# Patient Record
Sex: Female | Born: 1985
Health system: Southern US, Community
[De-identification: ages and names within clinical notes are randomized; demographics above are authoritative.]

## PROBLEM LIST (undated history)

## (undated) ENCOUNTER — Inpatient Hospital Stay (HOSPITAL_COMMUNITY): Payer: Self-pay

## (undated) DIAGNOSIS — J45909 Unspecified asthma, uncomplicated: Secondary | ICD-10-CM

## (undated) DIAGNOSIS — F909 Attention-deficit hyperactivity disorder, unspecified type: Secondary | ICD-10-CM

## (undated) HISTORY — DX: Attention-deficit hyperactivity disorder, unspecified type: F90.9

## (undated) HISTORY — PX: NO PAST SURGERIES: SHX2092

---

## 2017-02-09 ENCOUNTER — Ambulatory Visit (HOSPITAL_COMMUNITY): Payer: Self-pay | Admitting: Psychiatry

## 2017-11-22 NOTE — L&D Delivery Note (Signed)
Delivery Note At 10:43 PM a viable female was delivered via Vaginal, Spontaneous (Presentation:direct OA).  APGAR: 8,9 ; weight pending .   Placenta status:  spontaneous ,intact.   Cord:  with the following complications: None .  Cord pH: n/a Placenta felt warm.  Maternal temp 101.2.  Antibiotics held.  Anesthesia:  Epidural Episiotomy:  None Lacerations: Periurethral;Vaginal (left hymenal) Suture Repair: 3.0 chromic Est. Blood Loss (mL): 122  Mom to postpartum.  Baby to Couplet care / Skin to Skin.  Shelly Allen 10/30/2018, 11:43 PM

## 2017-12-16 ENCOUNTER — Other Ambulatory Visit: Payer: Self-pay

## 2017-12-16 ENCOUNTER — Inpatient Hospital Stay (HOSPITAL_COMMUNITY): Payer: 59

## 2017-12-16 ENCOUNTER — Other Ambulatory Visit: Payer: Self-pay | Admitting: Obstetrics and Gynecology

## 2017-12-16 ENCOUNTER — Inpatient Hospital Stay (HOSPITAL_COMMUNITY)
Admission: AD | Admit: 2017-12-16 | Discharge: 2017-12-16 | Disposition: A | Discharge status: Home / Self Care | Payer: 59 | Source: Ambulatory Visit | Attending: Obstetrics and Gynecology | Admitting: Obstetrics and Gynecology

## 2017-12-16 ENCOUNTER — Encounter (HOSPITAL_COMMUNITY): Payer: Self-pay | Admitting: *Deleted

## 2017-12-16 DIAGNOSIS — R1032 Left lower quadrant pain: Secondary | ICD-10-CM | POA: Diagnosis not present

## 2017-12-16 DIAGNOSIS — Z3A10 10 weeks gestation of pregnancy: Secondary | ICD-10-CM | POA: Insufficient documentation

## 2017-12-16 DIAGNOSIS — O209 Hemorrhage in early pregnancy, unspecified: Secondary | ICD-10-CM | POA: Diagnosis present

## 2017-12-16 DIAGNOSIS — O3680X0 Pregnancy with inconclusive fetal viability, not applicable or unspecified: Secondary | ICD-10-CM

## 2017-12-16 DIAGNOSIS — O26891 Other specified pregnancy related conditions, first trimester: Secondary | ICD-10-CM | POA: Diagnosis not present

## 2017-12-16 DIAGNOSIS — R109 Unspecified abdominal pain: Secondary | ICD-10-CM

## 2017-12-16 NOTE — MAU Note (Signed)
Urine is in the Lab 

## 2017-12-16 NOTE — MAU Note (Signed)
Sharen CounterLisa Leftwich-Kirby CNM spoke with pt in Triage regarding test results and d/c plan. CNM discussed d/c instructions with pt and understanding voiced. PT d/c home from Triage

## 2017-12-16 NOTE — MAU Note (Signed)
Dr sent her here from [facility] , been having cramping and spotting today. ? Ectopic, saw a saw, but nothing in it, lagging 5 wks from LMP

## 2017-12-16 NOTE — MAU Provider Note (Signed)
Chief Complaint: Vaginal Bleeding and Abdominal Pain   First Provider Initiated Contact with Patient 12/16/17 2122      SUBJECTIVE HPI: Shelly Allen is a 32 y.o. G1P0 at [redacted]w[redacted]d by unsure LMP presents to MAU sent from the office with scant bleeding and mild LLQ cramping pain. In the office, GS was visualized on [place] but no YS or FP.  Quant hcg today was 14, 312.  She has not tried any treatments. There are no other associated symptoms.      HPI  No past medical history on file.  Social History   Socioeconomic History  . Marital status: Married    Spouse name: Not on file  . Number of children: Not on file  . Years of education: Not on file  . Highest education level: Not on file  Social Needs  . Financial resource strain: Not on file  . Food insecurity - worry: Not on file  . Food insecurity - inability: Not on file  . Transportation needs - medical: Not on file  . Transportation needs - non-medical: Not on file  Occupational History  . Not on file  Tobacco Use  . Smoking status: Not on file  Substance and Sexual Activity  . Alcohol use: Not on file  . Drug use: Not on file  . Sexual activity: Not on file  Other Topics Concern  . Not on file  Social History Narrative  . Not on file   No current facility-administered medications on file prior to encounter.    No current outpatient medications on file prior to encounter.   Allergies not on file  ROS:  Review of Systems  Constitutional: Negative for chills, fatigue and fever.  Respiratory: Negative for shortness of breath.   Cardiovascular: Negative for chest pain.  Genitourinary: Positive for pelvic pain and vaginal bleeding. Negative for difficulty urinating, dysuria, flank pain, vaginal discharge and vaginal pain.  Neurological: Negative for dizziness and headaches.  Psychiatric/Behavioral: Negative.      I have reviewed patient's Past Medical Hx, Surgical Hx, Family Hx, Social Hx, medications and allergies.    Physical Exam   Patient Vitals for the past 24 hrs:  BP Temp Temp src Pulse Resp SpO2 Height Weight  12/16/17 1826 112/70 98.3 F (36.8 C) Oral 64 17 100 % 5' 4.5" (1.638 m) 115 lb 8 oz (52.4 kg)   Constitutional: Well-developed, well-nourished female in no acute distress.  Cardiovascular: normal rate Respiratory: normal effort GI: Abd soft, non-tender. Pos BS x 4 MS: Extremities nontender, no edema, normal ROM Neurologic: Alert and oriented x 4.  GU: Neg CVAT.  PELVIC EXAM: Deferred   LAB RESULTS No results found for this or any previous visit (from the past 24 hour(s)).  Quant hcg in office 12/16/17 was 44034    IMAGING US Ob Comp Less 14 Wks  Result Date: 12/16/2017 CLINICAL DATA:  Bleeding and cramping today. HCG 14,000 per patient, drawn at referring office. EXAM: OBSTETRIC <14 WK [place] AND TRANSVAGINAL OB US TECHNIQUE: Both transabdominal and transvaginal ultrasound examinations were performed for complete evaluation of the gestation as well as the maternal uterus, adnexal regions, and pelvic cul-de-sac. Transvaginal technique was performed to assess early pregnancy. COMPARISON:  None. FINDINGS: Intrauterine gestational sac: Single Yolk sac:  Not visualized Embryo:  Not visualized Cardiac Activity: Not visualized Heart Rate: Not applicable MSD: 9.2  mm   5 w   5 d Subchorionic hemorrhage:  None visualized. Maternal uterus/adnexae: Normal IMPRESSION: Probable early intrauterine gestational  sac, but no yolk sac, fetal pole, or cardiac activity yet visualized. Recommend follow-up quantitative B-HCG levels and follow-up US in 14 days to assess viability. This recommendation follows SRU consensus guidelines: Diagnostic Criteria for Nonviable Pregnancy Early in the First Trimester. Malva Limes Engl J Med 2013; 161:0960-45; 369:1443-51. Electronically Signed   By: Tollie Ethavid  Kwon M.D.   On: 12/16/2017 21:09   [place] Ob Transvaginal  Result Date: 12/16/2017 CLINICAL DATA:  Bleeding and cramping today. HCG 14,000 per  patient, drawn at referring office. EXAM: OBSTETRIC <14 WK US AND TRANSVAGINAL OB US TECHNIQUE: Both transabdominal and transvaginal ultrasound examinations were performed for complete evaluation of the gestation as well as the maternal uterus, adnexal regions, and pelvic cul-de-sac. Transvaginal technique was performed to assess early pregnancy. COMPARISON:  None. FINDINGS: Intrauterine gestational sac: Single Yolk sac:  Not visualized Embryo:  Not visualized Cardiac Activity: Not visualized Heart Rate: Not applicable MSD: 9.2  mm   5 w   5 d Subchorionic hemorrhage:  None visualized. Maternal uterus/adnexae: Normal IMPRESSION: Probable early intrauterine gestational sac, but no yolk sac, fetal pole, or cardiac activity yet visualized. Recommend follow-up quantitative B-HCG levels and follow-up US in 14 days to assess viability. This recommendation follows SRU consensus guidelines: Diagnostic Criteria for Nonviable Pregnancy Early in the First Trimester. Malva Limes Engl J Med 2013; 409:8119-14; 369:1443-51. Electronically Signed   By: Tollie Ethavid  Kwon M.D.   On: 12/16/2017 21:09    MAU Management/MDM: US with similar results as the office US with gestational sac but no yolk sac or fetal pole.  Pt with mild LLQ pain. Consult Dr Dion BodyVarnado. Findings today could represent a normal early pregnancy, spontaneous abortion or ectopic pregnancy which can be life-threatening.  Ectopic precautions were given to the patient with plan to return in 48 hours for repeat quant hcg to evaluate pregnancy development.   Pt to return on Sunday 12/18/16 for repeat quant hcg or sooner if more pain/bleeding. Pt discharged with strict ectopic precautions.  ASSESSMENT 1. Abdominal pain during pregnancy in first trimester   2. Pregnancy of unknown anatomic location   3. Vaginal bleeding in pregnancy, first trimester     PLAN Discharge home Allergies as of 12/16/2017   Not on File     Medication List    You have not been prescribed any medications.       Sharen CounterLisa Leftwich-Kirby Certified Nurse-Midwife 12/16/2017  11:05 PM

## 2017-12-17 ENCOUNTER — Inpatient Hospital Stay (HOSPITAL_COMMUNITY)
Admission: AD | Admit: 2017-12-17 | Discharge: 2017-12-17 | Disposition: A | Discharge status: Home / Self Care | Payer: 59 | Source: Ambulatory Visit | Attending: Obstetrics and Gynecology | Admitting: Obstetrics and Gynecology

## 2017-12-17 ENCOUNTER — Inpatient Hospital Stay (HOSPITAL_COMMUNITY): Payer: 59

## 2017-12-17 ENCOUNTER — Encounter (HOSPITAL_COMMUNITY): Payer: Self-pay

## 2017-12-17 DIAGNOSIS — R109 Unspecified abdominal pain: Secondary | ICD-10-CM | POA: Diagnosis present

## 2017-12-17 DIAGNOSIS — N939 Abnormal uterine and vaginal bleeding, unspecified: Secondary | ICD-10-CM

## 2017-12-17 DIAGNOSIS — Z3A10 10 weeks gestation of pregnancy: Secondary | ICD-10-CM | POA: Diagnosis not present

## 2017-12-17 DIAGNOSIS — O039 Complete or unspecified spontaneous abortion without complication: Secondary | ICD-10-CM | POA: Insufficient documentation

## 2017-12-17 LAB — CBC
HCT: 34.4 % — ABNORMAL LOW (ref 36.0–46.0)
HEMOGLOBIN: 12.2 g/dL (ref 12.0–15.0)
MCH: 31 pg (ref 26.0–34.0)
MCHC: 35.5 g/dL (ref 30.0–36.0)
MCV: 87.3 fL (ref 78.0–100.0)
Platelets: 189 10*3/uL (ref 150–400)
RBC: 3.94 MIL/uL (ref 3.87–5.11)
RDW: 12.2 % (ref 11.5–15.5)
WBC: 7.1 10*3/uL (ref 4.0–10.5)

## 2017-12-17 MED ORDER — HYDROMORPHONE HCL 1 MG/ML IJ SOLN
1.0000 mg | Freq: Once | INTRAMUSCULAR | Status: AC
  Administered 2017-12-17: 1 mg via INTRAMUSCULAR
  Filled 2017-12-17: qty 1

## 2017-12-17 NOTE — Discharge Instructions (Signed)

## 2017-12-17 NOTE — MAU Provider Note (Signed)
History     CSN: [id number]  Arrival date and time: 12/17/17 [id number]   First Provider Initiated Contact with Patient 12/17/17 [phone] [id number]     Chief Complaint  Patient presents with  . Abdominal Pain  . Vaginal Bleeding  . Emesis   HPI Shelly Allen is a 32 y.o. G1P0 at 9077w3d by unsure LMP who presents with abdominal pain and vaginal bleeding. In the office yesterday afternoon, GS was visualized on US but no YS or FP.  Quant hcg today was 14,312. US in MAU last night showed the same. She reports at 0000 she began having cramping that she would rate a 10/10 and has not tried anything for the pain. She also reports vaginal bleeding like a period. She had vomiting upon arrival to MAU.  OB History    Gravida Para Term Preterm AB Living   1             SAB TAB Ectopic Multiple Live Births                  History reviewed. No pertinent past medical history.  History reviewed. No pertinent surgical history.  No family history on file.  Social History   Tobacco Use  . Smoking status: Never Smoker  . Smokeless tobacco: Never Used  Substance Use Topics  . Alcohol use: No    Frequency: Never  . Drug use: No    Allergies: No Known Allergies  No medications prior to admission.    Review of Systems  Constitutional: Negative.  Negative for fatigue and fever.  HENT: Negative.   Respiratory: Negative.  Negative for shortness of breath.   Cardiovascular: Negative.  Negative for chest pain.  Gastrointestinal: Positive for abdominal pain. Negative for constipation, diarrhea, nausea and vomiting.  Genitourinary: Positive for vaginal bleeding. Negative for dysuria.  Neurological: Negative.  Negative for dizziness and headaches.   Physical Exam   Blood pressure 124/78, pulse 80, temperature 98.1 F (36.7 C), temperature source Oral, resp. rate 20, last menstrual period 10/05/2017, SpO2 98 %.  Physical Exam  Nursing note and vitals reviewed. Constitutional: She is oriented to person,  place, and time. She appears well-developed and well-nourished. No distress.  HENT:  Head: Normocephalic.  Eyes: Pupils are equal, round, and reactive to light.  Cardiovascular: Normal rate, regular rhythm and normal heart sounds.  Respiratory: Effort normal and breath sounds normal. No respiratory distress.  GI: Soft. Bowel sounds are normal. She exhibits no distension. There is no tenderness.  Genitourinary:  Genitourinary Comments: Large amount of dark red blood in vault. Products of conception protruding from cervix.   Neurological: She is alert and oriented to person, place, and time.  Skin: Skin is warm and dry.  Psychiatric: She has a normal mood and affect. Her behavior is normal. Judgment and thought content normal.    MAU Course  Procedures Results for orders placed or performed during the hospital encounter of 12/17/17 (from the past 24 hour(s))  CBC     Status: Abnormal   Collection Time: 12/17/17  4:53 AM  Result Value Ref Range   WBC 7.1 4.0 - 10.5 K/uL   RBC 3.94 3.87 - 5.11 MIL/uL   Hemoglobin 12.2 12.0 - 15.0 g/dL   HCT 47.834.4 (L) 29.536.0 - 62.146.0 %   MCV 87.3 78.0 - 100.0 fL   MCH 31.0 26.0 - 34.0 pg   MCHC 35.5 30.0 - 36.0 g/dL   RDW 30.812.2 65.711.5 - 84.615.5 %   Platelets 189  150 - 400 K/uL    US Ob Comp Less 14 Wks  Result Date: 12/16/2017 CLINICAL DATA:  Bleeding and cramping today. HCG 14,000 per patient, drawn at referring office. EXAM: OBSTETRIC <14 WK [place] AND TRANSVAGINAL OB US TECHNIQUE: Both transabdominal and transvaginal ultrasound examinations were performed for complete evaluation of the gestation as well as the maternal uterus, adnexal regions, and pelvic cul-de-sac. Transvaginal technique was performed to assess early pregnancy. COMPARISON:  None. FINDINGS: Intrauterine gestational sac: Single Yolk sac:  Not visualized Embryo:  Not visualized Cardiac Activity: Not visualized Heart Rate: Not applicable MSD: 9.2  mm   5 w   5 d Subchorionic hemorrhage:  None  visualized. Maternal uterus/adnexae: Normal IMPRESSION: Probable early intrauterine gestational sac, but no yolk sac, fetal pole, or cardiac activity yet visualized. Recommend follow-up quantitative B-HCG levels and follow-up US in 14 days to assess viability. This recommendation follows SRU consensus guidelines: Diagnostic Criteria for Nonviable Pregnancy Early in the First Trimester. Malva Limes Med 2013; 098:1191-47. Electronically Signed   By: Tollie Eth M.D.   On: 12/16/2017 21:09   US Ob Transvaginal  Result Date: 12/17/2017 CLINICAL DATA:  Abdominal pain and heavy bleeding. Follow-up from yesterday. EXAM: TRANSVAGINAL OB ULTRASOUND TECHNIQUE: Transvaginal ultrasound was performed for complete evaluation of the gestation as well as the maternal uterus, adnexal regions, and pelvic cul-de-sac. COMPARISON:  12/16/2017 FINDINGS: Intrauterine gestational sac: No intrauterine gestational sac identified today. The gestational sac seen on the previous study is no longer present suggesting interval loss of pregnancy. Yolk sac:  Not identified. Embryo:  Not identified. Cardiac Activity: Not identified. Maternal uterus/adnexae: Uterus is mildly anteverted. No myometrial mass lesions identified. Endometrial stripe thickness measures about 1.2 cm. No endometrial fluid collections. No significant flow is demonstrated within the endometrium on color flow Doppler imaging. Both ovaries are visualized and are normal. No abnormal pelvic fluid collections. IMPRESSION: Intrauterine gestational sac seen on previous study is no longer demonstrated today consistent with interval loss of pregnancy. Electronically Signed   By: Burman Nieves M.D.   On: 12/17/2017 05:06   US Ob Transvaginal  Result Date: 12/16/2017 CLINICAL DATA:  Bleeding and cramping today. HCG 14,000 per patient, drawn at referring office. EXAM: OBSTETRIC <14 WK [place] AND TRANSVAGINAL OB US TECHNIQUE: Both transabdominal and transvaginal ultrasound examinations  were performed for complete evaluation of the gestation as well as the maternal uterus, adnexal regions, and pelvic cul-de-sac. Transvaginal technique was performed to assess early pregnancy. COMPARISON:  None. FINDINGS: Intrauterine gestational sac: Single Yolk sac:  Not visualized Embryo:  Not visualized Cardiac Activity: Not visualized Heart Rate: Not applicable MSD: 9.2  mm   5 w   5 d Subchorionic hemorrhage:  None visualized. Maternal uterus/adnexae: Normal IMPRESSION: Probable early intrauterine gestational sac, but no yolk sac, fetal pole, or cardiac activity yet visualized. Recommend follow-up quantitative B-HCG levels and follow-up US in 14 days to assess viability. This recommendation follows SRU consensus guidelines: Diagnostic Criteria for Nonviable Pregnancy Early in the First Trimester. Malva Limes Med 2013; 829:5621-30. Electronically Signed   By: Tollie Eth M.D.   On: 12/16/2017 21:09    MDM Dilaudid IM US OB Transvaginal- gestational sac on previous ultrasound no longer seen. No retained POC Surgical Pathology CBC  Reviewed with Dr. Dion Body- ok to discharge patient home and follow up in 2 weeks in the office.   Assessment and Plan   1. Complete abortion   2. Vaginal bleeding    -Discharge home in  stable condition -Abdominal pain and vaginal bleeding precautions discussed -Patient advised to follow-up with [facility] in 2 weeks -Patient may return to MAU as needed or if her condition were to change or worsen   Rolm Bookbinder CNM 12/17/2017, 5:15 AM

## 2017-12-17 NOTE — MAU Note (Signed)
Pt states she was seen here earlier-reports worsening abdominal pain and bleeding since midnight. Pt has not taken anything for pain. Reports only changing pad once. Started having some vomiting about 45 mins prior to arrival.

## 2018-02-02 ENCOUNTER — Other Ambulatory Visit: Payer: Self-pay | Admitting: Obstetrics and Gynecology

## 2018-02-02 ENCOUNTER — Other Ambulatory Visit (HOSPITAL_COMMUNITY)
Admission: RE | Admit: 2018-02-02 | Discharge: 2018-02-02 | Disposition: A | Discharge status: Home / Self Care | Payer: 59 | Source: Ambulatory Visit | Attending: Obstetrics and Gynecology | Admitting: Obstetrics and Gynecology

## 2018-02-02 DIAGNOSIS — Z01411 Encounter for gynecological examination (general) (routine) with abnormal findings: Secondary | ICD-10-CM | POA: Diagnosis not present

## 2018-02-06 LAB — CYTOLOGY - PAP
Diagnosis: NEGATIVE
HPV: NOT DETECTED

## 2018-03-03 LAB — OB RESULTS CONSOLE ABO/RH: RH TYPE: POSITIVE

## 2018-03-03 LAB — OB RESULTS CONSOLE HIV ANTIBODY (ROUTINE TESTING): HIV: NONREACTIVE

## 2018-03-03 LAB — OB RESULTS CONSOLE RPR: RPR: NONREACTIVE

## 2018-03-03 LAB — OB RESULTS CONSOLE ANTIBODY SCREEN: Antibody Screen: NEGATIVE

## 2018-03-03 LAB — OB RESULTS CONSOLE GC/CHLAMYDIA
Chlamydia: NEGATIVE
Gonorrhea: NEGATIVE

## 2018-03-03 LAB — OB RESULTS CONSOLE RUBELLA ANTIBODY, IGM: RUBELLA: IMMUNE

## 2018-03-03 LAB — OB RESULTS CONSOLE HEPATITIS B SURFACE ANTIGEN: HEP B S AG: NEGATIVE

## 2018-07-28 ENCOUNTER — Encounter: Payer: 59 | Attending: Obstetrics and Gynecology | Admitting: Registered"

## 2018-07-28 DIAGNOSIS — Z3A00 Weeks of gestation of pregnancy not specified: Secondary | ICD-10-CM | POA: Insufficient documentation

## 2018-07-28 DIAGNOSIS — O2602 Excessive weight gain in pregnancy, second trimester: Secondary | ICD-10-CM | POA: Insufficient documentation

## 2018-07-28 DIAGNOSIS — Z713 Dietary counseling and surveillance: Secondary | ICD-10-CM | POA: Insufficient documentation

## 2018-07-28 NOTE — Progress Notes (Signed)
Patient states she is 27 wks and was referred because at last OB appointment the baby appears to be growing too fast. Patient states diabetes runs in her family and she is scheduled to have the OGTT on Monday. RD advised that she wait to have nutrition education because if she has GDM it is a different conversation than just general nutrition. GDM education includes general nutrition as well as specific guidance for blood sugar control. Patient agreed to plan. No charge for today.

## 2018-07-31 ENCOUNTER — Encounter: Payer: Self-pay | Admitting: Dietician

## 2018-07-31 ENCOUNTER — Encounter: Payer: 59 | Admitting: Dietician

## 2018-07-31 DIAGNOSIS — O2602 Excessive weight gain in pregnancy, second trimester: Secondary | ICD-10-CM | POA: Diagnosis not present

## 2018-07-31 DIAGNOSIS — Z713 Dietary counseling and surveillance: Secondary | ICD-10-CM | POA: Diagnosis not present

## 2018-07-31 DIAGNOSIS — Z3A00 Weeks of gestation of pregnancy not specified: Secondary | ICD-10-CM | POA: Diagnosis not present

## 2018-07-31 NOTE — Progress Notes (Addendum)
  Medical Nutrition Therapy:  Appt start time: 1120 end time:  1155.   Assessment:  Primary concerns today: Patient is here alone and is concerned about her weight gain during her pregnancy.   Weight prepregnancy was 108 lbs and today 146 lbs.  She has had a 39 lb weight gain at 27.5 weeks.  She had her glucose tolerance test today and does not know the results. She states that she has purposefully been trying to gain weight but then realized that it was too quickly and made some diet changes such as stopping the 2 chicken biscuits most mornings. 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  She will walk the dog occasionally but otherwise will not get much exercise.  She had a miscarriage at 11 weeks in the past and is nervous to exercise much. She has been tracking her intake on an app.    Preferred Learning Style:   No preference indicated   Learning Readiness:   Ready  Change in progress   MEDICATIONS: prenatal vitamin   DIETARY INTAKE:  Usual eating pattern includes 3 meals and 2-3 snacks per day. Stopped chicken biscuits and decreased fruit and added more variety 24-hr recall:  B ( AM): fried egg, provolone cheese, english muffin, 1/2 T butter, 1 1/2 strips bacon, 8 ounces tropicana OJ  Snk ( AM): banana  L ( PM): tuna salad, green apple, mayo, nut thin crackers Snk ( PM): 1/2 trader joes flatbread  D (7-8 PM): skinless chicken breast and homemade mac and cheese Snk ( PM): none Beverages: water, 8 ounces OJ OR green machine smoothie (1 daily)  Usual physical activity: walks the dogs once per week, cleans own home  Progress Towards Goal(s):  In progress.   Nutritional Diagnosis:  NB-1.1 Food and nutrition-related knowledge deficit As related to balance of protein, carbohydrates, fat.  As evidenced by patient report.    Intervention:  Nutrition counseling/education  related to mindful eating and importance of choosing foods with a high nutrition quality compared to empty calories.  Encouraged more intake of vegetables.  Discussed basic guidelines during pregnancy and need for more than 170 grams carbs per day. Continue 2-3 snacks along with healthy meals.  Supported patient in listening to her body and not eating more just for the baby when she has gotten full.  Discussed what a GDM visit would look like in the event she is diagnosed.  Teaching Method Utilized:  Visual Auditory Hands on  Handouts given during visit include:  Nutrition and pregnancy  Snack list  Meal plan card  Barriers to learning/adherence to lifestyle change: none  Demonstrated degree of understanding via:  Teach Back   Monitoring/Evaluation:  Dietary intake, exercise, and body weight prn.

## 2018-10-02 LAB — OB RESULTS CONSOLE GBS: STREP GROUP B AG: POSITIVE

## 2018-10-04 ENCOUNTER — Encounter (HOSPITAL_COMMUNITY): Payer: Self-pay

## 2018-10-24 ENCOUNTER — Telehealth (HOSPITAL_COMMUNITY): Payer: Self-pay | Admitting: *Deleted

## 2018-10-24 NOTE — Telephone Encounter (Signed)
Preadmission screen  

## 2018-10-26 ENCOUNTER — Encounter (HOSPITAL_COMMUNITY): Payer: Self-pay | Admitting: *Deleted

## 2018-10-26 ENCOUNTER — Inpatient Hospital Stay (HOSPITAL_COMMUNITY): Admission: AD | Admit: 2018-10-26 | Payer: 59 | Source: Ambulatory Visit | Admitting: Obstetrics and Gynecology

## 2018-10-29 NOTE — H&P (Signed)
Shelly Allen is a 32 y.o. female G2 P0010 @ 39 5/7 weeks presenting for routine OB.  Pt desires to proceed with post dates induction.   PNC with [facility] Dion Body([facility]) has been uncomplicated.  Pt is GBS positive.  Pt has a h/o Asthma, but has not had a flare up in over a year.  OB History    Gravida  2   Para      Term      Preterm      AB  1   Living        SAB  1   TAB      Ectopic      Multiple      Live Births             Past Medical History:  Diagnosis Date  . ADHD    Past Surgical History:  Procedure Laterality Date  . NO PAST SURGERIES     Family History: family history includes Breast cancer in her maternal grandmother; Cancer in her maternal grandmother; Diabetes in her maternal grandmother. Social History:  reports that she has never smoked. She has never used smokeless tobacco. She reports that she does not drink alcohol or use drugs.     Maternal Diabetes: No Genetic Screening: Normal Maternal Ultrasounds/Referrals: Normal Fetal Ultrasounds or other Referrals:  None Maternal Substance Abuse:  No Significant Maternal Medications:  None Significant Maternal Lab Results:  Lab values include: Group B Strep positive Other Comments:  None  Review of Systems  Gastrointestinal: Negative for abdominal pain.   Maternal Medical History:  Contractions: Frequency: rare.    Fetal activity: Perceived fetal activity is normal.    Prenatal complications: no prenatal complications Prenatal Complications - Diabetes: none.      Last menstrual period 01/19/2018, unknown if currently breastfeeding. Maternal Exam:  Abdomen: Patient reports no abdominal tenderness. Estimated fetal weight is 7 lbs 6 oz +/- 7 oz on ultrasound 10/24/18.   Fetal presentation: vertex  Introitus: Normal vulva. Pelvis: adequate for delivery.   Cervix: Cervix evaluated by digital exam.     Fetal Exam Fetal Monitor Review: Mode: hand-held doppler probe.   Baseline rate:  140s.      Physical Exam  Constitutional: She is oriented to person, place, and time. She appears well-developed and well-nourished. No distress.  HENT:  Head: Normocephalic and atraumatic.  Eyes: EOM are normal.  Neck: Normal range of motion.  Respiratory: Effort normal and breath sounds normal. No respiratory distress.  GI: There is no tenderness.  Musculoskeletal: Normal range of motion. She exhibits edema. She exhibits no tenderness.  Neurological: She is alert and oriented to person, place, and time.  Skin: Skin is warm and dry.  Psychiatric: She has a normal mood and affect.    Prenatal labs: ABO, Rh: B/Positive/-- (04/12 0000) Antibody: Negative (04/12 0000) Rubella: Immune (04/12 0000) RPR: Nonreactive (04/12 0000)  HBsAg: Negative (04/12 0000)  HIV: Non-reactive (04/12 0000)  GBS: Positive (11/11 0000)   Assessment/Plan: IUP @ 39 5/7 weeks for IOL. GBS+.  PCN in active labor or SROM. H/o mild Asthma-not active. Place Cytotec on admission. Pt counseled on risks and procedure.  Geryl Rankinsvelyn Harry Bark 10/24/2018, 9:04 PM

## 2018-10-30 ENCOUNTER — Inpatient Hospital Stay (HOSPITAL_COMMUNITY): Payer: 59 | Admitting: Anesthesiology

## 2018-10-30 ENCOUNTER — Encounter (HOSPITAL_COMMUNITY): Payer: Self-pay

## 2018-10-30 ENCOUNTER — Inpatient Hospital Stay (HOSPITAL_COMMUNITY)
Admission: RE | Admit: 2018-10-30 | Discharge: 2018-11-01 | DRG: 806 | Disposition: A | Discharge status: Home / Self Care | Payer: 59 | Attending: Obstetrics and Gynecology | Admitting: Obstetrics and Gynecology

## 2018-10-30 DIAGNOSIS — Z3490 Encounter for supervision of normal pregnancy, unspecified, unspecified trimester: Secondary | ICD-10-CM

## 2018-10-30 DIAGNOSIS — O99824 Streptococcus B carrier state complicating childbirth: Secondary | ICD-10-CM | POA: Diagnosis present

## 2018-10-30 DIAGNOSIS — Z3A39 39 weeks gestation of pregnancy: Secondary | ICD-10-CM

## 2018-10-30 DIAGNOSIS — J45909 Unspecified asthma, uncomplicated: Secondary | ICD-10-CM | POA: Diagnosis present

## 2018-10-30 DIAGNOSIS — O9952 Diseases of the respiratory system complicating childbirth: Secondary | ICD-10-CM | POA: Diagnosis present

## 2018-10-30 DIAGNOSIS — O480 Post-term pregnancy: Secondary | ICD-10-CM | POA: Diagnosis present

## 2018-10-30 LAB — TYPE AND SCREEN
ABO/RH(D): B POS
ANTIBODY SCREEN: NEGATIVE

## 2018-10-30 LAB — CBC
HCT: 35.6 % — ABNORMAL LOW (ref 36.0–46.0)
Hemoglobin: 11.8 g/dL — ABNORMAL LOW (ref 12.0–15.0)
MCH: 31.5 pg (ref 26.0–34.0)
MCHC: 33.1 g/dL (ref 30.0–36.0)
MCV: 94.9 fL (ref 80.0–100.0)
Platelets: 188 10*3/uL (ref 150–400)
RBC: 3.75 MIL/uL — ABNORMAL LOW (ref 3.87–5.11)
RDW: 13.3 % (ref 11.5–15.5)
WBC: 9 10*3/uL (ref 4.0–10.5)
nRBC: 0 % (ref 0.0–0.2)

## 2018-10-30 LAB — RPR: RPR Ser Ql: NONREACTIVE

## 2018-10-30 LAB — ABO/RH: ABO/RH(D): B POS

## 2018-10-30 MED ORDER — EPHEDRINE 5 MG/ML INJ
10.0000 mg | INTRAVENOUS | Status: DC | PRN
  Filled 2018-10-30: qty 2

## 2018-10-30 MED ORDER — EPHEDRINE 5 MG/ML INJ: 10.0000 mg | INTRAVENOUS | Status: DC | PRN

## 2018-10-30 MED ORDER — LIDOCAINE HCL (PF) 1 % IJ SOLN
INTRAMUSCULAR | Status: DC | PRN
  Administered 2018-10-30: 5 mL via EPIDURAL

## 2018-10-30 MED ORDER — DIPHENHYDRAMINE HCL 50 MG/ML IJ SOLN: 12.5000 mg | INTRAMUSCULAR | Status: DC | PRN

## 2018-10-30 MED ORDER — SOD CITRATE-CITRIC ACID 500-334 MG/5ML PO SOLN: 30.0000 mL | ORAL | Status: DC | PRN

## 2018-10-30 MED ORDER — FENTANYL 2.5 MCG/ML BUPIVACAINE 1/10 % EPIDURAL INFUSION (WH - ANES)
INTRAMUSCULAR | Status: AC
  Filled 2018-10-30: qty 100

## 2018-10-30 MED ORDER — LACTATED RINGERS IV SOLN
INTRAVENOUS | Status: DC
  Administered 2018-10-30 (×4): via INTRAVENOUS

## 2018-10-30 MED ORDER — PHENYLEPHRINE 40 MCG/ML (10ML) SYRINGE FOR IV PUSH (FOR BLOOD PRESSURE SUPPORT)
80.0000 ug | PREFILLED_SYRINGE | INTRAVENOUS | Status: DC | PRN
  Filled 2018-10-30: qty 10

## 2018-10-30 MED ORDER — OXYCODONE-ACETAMINOPHEN 5-325 MG PO TABS: 2.0000 | ORAL_TABLET | ORAL | Status: DC | PRN

## 2018-10-30 MED ORDER — FENTANYL 2.5 MCG/ML BUPIVACAINE 1/10 % EPIDURAL INFUSION (WH - ANES)
INTRAMUSCULAR | Status: DC | PRN
  Administered 2018-10-30: 300 ug via EPIDURAL
  Administered 2018-10-30: 14 mL/h via EPIDURAL

## 2018-10-30 MED ORDER — FENTANYL CITRATE (PF) 100 MCG/2ML IJ SOLN
100.0000 ug | INTRAMUSCULAR | Status: DC | PRN
  Administered 2018-10-30: 100 ug via INTRAVENOUS

## 2018-10-30 MED ORDER — ZOLPIDEM TARTRATE 5 MG PO TABS: 5.0000 mg | ORAL_TABLET | Freq: Every evening | ORAL | Status: DC | PRN

## 2018-10-30 MED ORDER — SODIUM CHLORIDE 0.9 % IV SOLN
5.0000 10*6.[IU] | Freq: Once | INTRAVENOUS | Status: AC
  Administered 2018-10-30: 5 10*6.[IU] via INTRAVENOUS
  Filled 2018-10-30: qty 5

## 2018-10-30 MED ORDER — PHENYLEPHRINE 40 MCG/ML (10ML) SYRINGE FOR IV PUSH (FOR BLOOD PRESSURE SUPPORT)
PREFILLED_SYRINGE | INTRAVENOUS | Status: AC
  Filled 2018-10-30: qty 10

## 2018-10-30 MED ORDER — ONDANSETRON HCL 4 MG/2ML IJ SOLN: 4.0000 mg | Freq: Four times a day (QID) | INTRAMUSCULAR | Status: DC | PRN

## 2018-10-30 MED ORDER — LACTATED RINGERS IV SOLN
500.0000 mL | Freq: Once | INTRAVENOUS | Status: DC
  Administered 2018-10-30: 500 mL via INTRAVENOUS

## 2018-10-30 MED ORDER — TERBUTALINE SULFATE 1 MG/ML IJ SOLN
0.2500 mg | Freq: Once | INTRAMUSCULAR | Status: DC | PRN
  Filled 2018-10-30: qty 1

## 2018-10-30 MED ORDER — FENTANYL 2.5 MCG/ML BUPIVACAINE 1/10 % EPIDURAL INFUSION (WH - ANES): 14.0000 mL/h | INTRAMUSCULAR | Status: DC | PRN

## 2018-10-30 MED ORDER — MISOPROSTOL 25 MCG QUARTER TABLET
25.0000 ug | ORAL_TABLET | ORAL | Status: DC | PRN
  Administered 2018-10-30 (×2): 25 ug via VAGINAL
  Filled 2018-10-30 (×3): qty 1

## 2018-10-30 MED ORDER — LIDOCAINE HCL (PF) 1 % IJ SOLN
30.0000 mL | INTRAMUSCULAR | Status: DC | PRN
  Filled 2018-10-30: qty 30

## 2018-10-30 MED ORDER — PENICILLIN G 3 MILLION UNITS IVPB - SIMPLE MED
3.0000 10*6.[IU] | INTRAVENOUS | Status: DC
  Administered 2018-10-30 (×3): 3 10*6.[IU] via INTRAVENOUS
  Filled 2018-10-30 (×6): qty 100

## 2018-10-30 MED ORDER — LACTATED RINGERS IV SOLN: 500.0000 mL | Freq: Once | INTRAVENOUS | Status: DC

## 2018-10-30 MED ORDER — OXYTOCIN 40 UNITS IN LACTATED RINGERS INFUSION - SIMPLE MED
1.0000 m[IU]/min | INTRAVENOUS | Status: DC
  Administered 2018-10-30: 1 m[IU]/min via INTRAVENOUS

## 2018-10-30 MED ORDER — PROMETHAZINE HCL 25 MG/ML IJ SOLN
12.5000 mg | Freq: Four times a day (QID) | INTRAMUSCULAR | Status: DC | PRN
  Administered 2018-10-30: 12.5 mg via INTRAVENOUS
  Filled 2018-10-30: qty 1

## 2018-10-30 MED ORDER — HYDROXYZINE HCL 50 MG PO TABS
50.0000 mg | ORAL_TABLET | Freq: Four times a day (QID) | ORAL | Status: DC | PRN
  Filled 2018-10-30: qty 1

## 2018-10-30 MED ORDER — PHENYLEPHRINE 40 MCG/ML (10ML) SYRINGE FOR IV PUSH (FOR BLOOD PRESSURE SUPPORT): 80.0000 ug | PREFILLED_SYRINGE | INTRAVENOUS | Status: DC | PRN

## 2018-10-30 MED ORDER — OXYTOCIN 40 UNITS IN LACTATED RINGERS INFUSION - SIMPLE MED
2.5000 [IU]/h | INTRAVENOUS | Status: DC
  Filled 2018-10-30: qty 1000

## 2018-10-30 MED ORDER — OXYTOCIN BOLUS FROM INFUSION
500.0000 mL | Freq: Once | INTRAVENOUS | Status: AC
  Administered 2018-10-30: 500 mL via INTRAVENOUS

## 2018-10-30 MED ORDER — ACETAMINOPHEN 325 MG PO TABS: 650.0000 mg | ORAL_TABLET | ORAL | Status: DC | PRN

## 2018-10-30 MED ORDER — OXYCODONE-ACETAMINOPHEN 5-325 MG PO TABS: 1.0000 | ORAL_TABLET | ORAL | Status: DC | PRN

## 2018-10-30 MED ORDER — BUTORPHANOL TARTRATE 1 MG/ML IJ SOLN
1.0000 mg | INTRAMUSCULAR | Status: DC | PRN
  Administered 2018-10-30 (×2): 1 mg via INTRAVENOUS
  Filled 2018-10-30 (×2): qty 1

## 2018-10-30 MED ORDER — FENTANYL CITRATE (PF) 100 MCG/2ML IJ SOLN
INTRAMUSCULAR | Status: AC
  Administered 2018-10-30: 100 ug via INTRAVENOUS
  Filled 2018-10-30: qty 2

## 2018-10-30 MED ORDER — LACTATED RINGERS IV SOLN
500.0000 mL | INTRAVENOUS | Status: DC | PRN
  Administered 2018-10-30 (×2): 500 mL via INTRAVENOUS
  Administered 2018-10-30: 250 mL via INTRAVENOUS

## 2018-10-30 NOTE — Progress Notes (Signed)
Shelly Allen is a 32 y.o. G2P0010 at 4170w4d  Subjective: Pt reports increasing pain with contractions.  Objective: BP (!) 91/56   Pulse 68   Temp 97.8 F (36.6 C) (Oral)   Resp 16   Ht 5\' 4"  (1.626 m)   Wt 76.7 kg   LMP 01/19/2018   BMI 29.03 kg/m  No intake/output data recorded. No intake/output data recorded.  FHT:  FHR: 120s bpm, variability: moderate,  accelerations:  Present,  decelerations:  Present spontaneous, late UC:   regular, every 2-3 minutes SVE:  1/70/-2  Labs: Lab Results  Component Value Date   WBC 9.0 10/30/2018   HGB 11.8 (L) 10/30/2018   HCT 35.6 (L) 10/30/2018   MCV 94.9 10/30/2018   PLT 188 10/30/2018    Assessment / Plan: IUP @ 40 4/7 weeks  Labor: S/p Cytotec x 2.  Foley bulb placed for further cervical ripening. Preeclampsia:  BP normal Fetal Wellbeing:  Category I Pain Control:  IV pain meds and Desires epidural when necessary. I/D:  GBS+, S/p 2 doses.  Hold PCN until SROM or active labor Anticipated MOD:  NSVD  Geryl Rankinsvelyn Heavin Sebree 10/30/2018, 8:14 AM

## 2018-10-30 NOTE — Progress Notes (Signed)
Late entry- Pt seen ~ 2:15 PM Updated by RN that pt got epidural and SROM. Order given to resume PCN. In to see pt.  She was resting quietly on her left side, appeared comfortable. Category tracing. 5 cm per RN.  Recommend repeat exam in 2 hours. Start Pitocin if no change.

## 2018-10-30 NOTE — Anesthesia Procedure Notes (Addendum)
Epidural Patient location during procedure: OB Start time: 10/30/2018 12:57 PM End time: 10/30/2018 1:11 PM  Staffing Anesthesiologist: Trevor IhaHouser, Stephen A, MD Performed: anesthesiologist   Preanesthetic Checklist Completed: patient identified, site marked, surgical consent, pre-op evaluation, timeout performed, IV checked, risks and benefits discussed and monitors and equipment checked  Epidural Patient position: sitting Prep: site prepped and draped and DuraPrep Patient monitoring: continuous pulse ox and blood pressure Approach: midline Location: L3-L4 Injection technique: LOR air  Needle:  Needle type: Tuohy  Needle gauge: 17 G Needle length: 9 cm and 9 Needle insertion depth: 5 cm cm Catheter type: closed end flexible Catheter size: 19 Gauge Catheter at skin depth: 10 cm Test dose: negative  Assessment Events: blood not aspirated, injection not painful, no injection resistance, negative IV test and no paresthesia  Additional Notes Patient identified. Risks/Benefits/Options discussed with patient including but not limited to bleeding, infection, nerve damage, paralysis, failed block, incomplete pain control, headache, blood pressure changes, nausea, vomiting, reactions to medication both or allergic, itching and postpartum back pain. Confirmed with bedside nurse the patient's most recent platelet count. Confirmed with patient that they are not currently taking any anticoagulation, have any bleeding history or any family history of bleeding disorders. Patient expressed understanding and wished to proceed. All questions were answered. Sterile technique was used throughout the entire procedure. Please see nursing notes for vital signs. Test dose was given through epidural needle and negative prior to continuing to dose epidural or start infusion. Warning signs of high block given to the patient including shortness of breath, tingling/numbness in hands, complete motor block, or any  concerning symptoms with instructions to call for help. Patient was given instructions on fall risk and not to get out of bed. All questions and concerns addressed with instructions to call with any issues.   1 Attempt (S) . Patient tolerated procedure well.

## 2018-10-30 NOTE — Anesthesia Pain Management Evaluation Note (Signed)
  CRNA Pain Management Visit Note  Patient: Shelly Allen, 32 y.o., female  "Hello I am a [facility] of the anesthesia [facility]. We have an anesthesia team available at all times to provide care throughout the hospital, including epidural management and anesthesia for C-section. I don't know your plan for the delivery whether it a natural birth, water birth, IV sedation, nitrous supplementation, doula or epidural, but we want to meet your pain goals."   1.Was your pain managed to your expectations on prior hospitalizations?   No prior hospitalizations  2.What is your expectation for pain management during this hospitalization?     Epidural and IV pain meds  3.How can we help you reach that goal? epidural  Record the patient's initial score and the patient's pain goal.   Pain: 4  Pain Goal: 4 [facility] wants you to be able to say your pain was always managed very well.  Joe Gee 10/30/2018

## 2018-10-30 NOTE — Anesthesia Preprocedure Evaluation (Signed)
Anesthesia Evaluation  Patient identified by MRN, date of birth, ID band Patient awake    Reviewed: Allergy & Precautions, H&P , NPO status , Patient's Chart, lab work & pertinent test results  Airway Mallampati: II  TM Distance: >3 FB Neck ROM: Full    Dental no notable dental hx. (+) Teeth Intact, Dental Advisory Given   Pulmonary    Pulmonary exam normal breath sounds clear to auscultation       Cardiovascular negative cardio ROS Normal cardiovascular exam Rhythm:Regular Rate:Normal     Neuro/Psych PSYCHIATRIC DISORDERS negative neurological ROS     GI/Hepatic Neg liver ROS,   Endo/Other  negative endocrine ROS  Renal/GU negative Renal ROS  negative genitourinary   Musculoskeletal negative musculoskeletal ROS (+)   Abdominal   Peds negative pediatric ROS (+)  Hematology negative hematology ROS (+)   Anesthesia Other Findings   Reproductive/Obstetrics (+) Pregnancy                             Lab Results  Component Value Date   WBC 9.0 10/30/2018   HGB 11.8 (L) 10/30/2018   HCT 35.6 (L) 10/30/2018   MCV 94.9 10/30/2018   PLT 188 10/30/2018    Anesthesia Physical Anesthesia Plan  ASA: II  Anesthesia Plan: Epidural   Post-op Pain Management:    Induction:   PONV Risk Score and Plan:   Airway Management Planned:   Additional Equipment:   Intra-op Plan:   Post-operative Plan:   Informed Consent: I have reviewed the patients History and Physical, chart, labs and discussed the procedure including the risks, benefits and alternatives for the proposed anesthesia with the patient or authorized representative who has indicated his/her understanding and acceptance.     Plan Discussed with:   Anesthesia Plan Comments:         Anesthesia Quick Evaluation

## 2018-10-30 NOTE — Progress Notes (Signed)
Pt feeling pressure, feels like need to have a BM.  Some pain.    BP 117/73   Pulse 77   Temp 98.3 F (36.8 C) (Oral)   Resp 16   Ht 5\' 4"  (1.626 m)   Wt 76.7 kg   LMP 01/19/2018   SpO2 98%   BMI 29.03 kg/m   Gen:  NAD Cervix: 8/80 on pt's right, +1 station.  +accels, good variability.  Contraction q2-4 min.  Irregular.  IUP @ 40 4/7 weeks IOL due to near postdates. SROM.  GBS+ on PCN. On Pitocin Category I. Anticipate SVD.

## 2018-10-31 ENCOUNTER — Encounter (HOSPITAL_COMMUNITY): Payer: Self-pay

## 2018-10-31 LAB — CBC
HCT: 30.7 % — ABNORMAL LOW (ref 36.0–46.0)
Hemoglobin: 10.3 g/dL — ABNORMAL LOW (ref 12.0–15.0)
MCH: 31.2 pg (ref 26.0–34.0)
MCHC: 33.6 g/dL (ref 30.0–36.0)
MCV: 93 fL (ref 80.0–100.0)
Platelets: 164 10*3/uL (ref 150–400)
RBC: 3.3 MIL/uL — ABNORMAL LOW (ref 3.87–5.11)
RDW: 13.6 % (ref 11.5–15.5)
WBC: 17.9 10*3/uL — ABNORMAL HIGH (ref 4.0–10.5)
nRBC: 0 % (ref 0.0–0.2)

## 2018-10-31 IMAGING — US US OB TRANSVAGINAL
1 series · 15 of 28 positions shown · non-contrast
Comparison: 12/16/2017

CLINICAL DATA: Abdominal pain and heavy bleeding. Follow-up from
yesterday.

EXAM:
TRANSVAGINAL OB ULTRASOUND
TECHNIQUE: Transvaginal ultrasound was performed for complete evaluation of the
gestation as well as the maternal uterus, adnexal regions, and
pelvic cul-de-sac.

[Series 1: us ob transvaginal · 39 acquisitions, 15 frames shown]
[im 1/39]
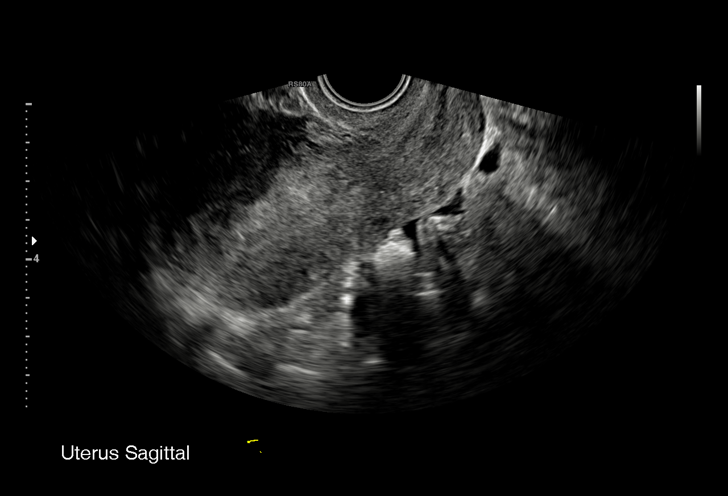
[im 3/39]
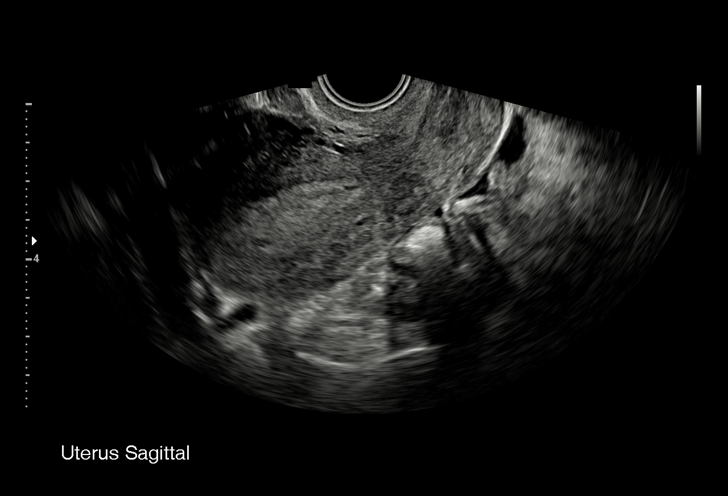
[im 6/39]
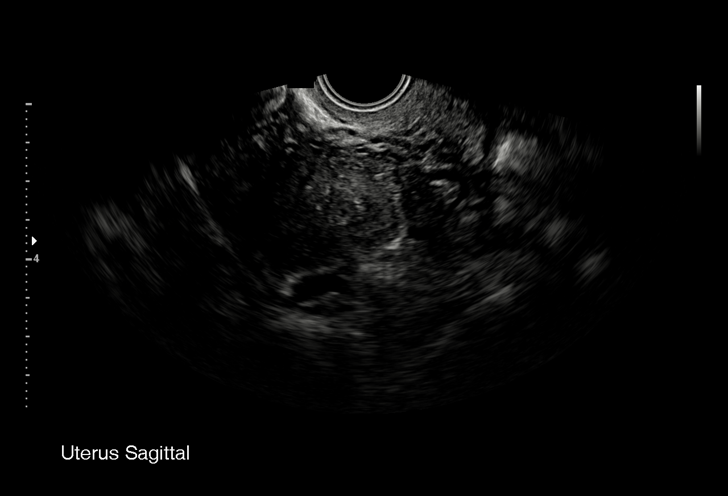
[im 9/39]
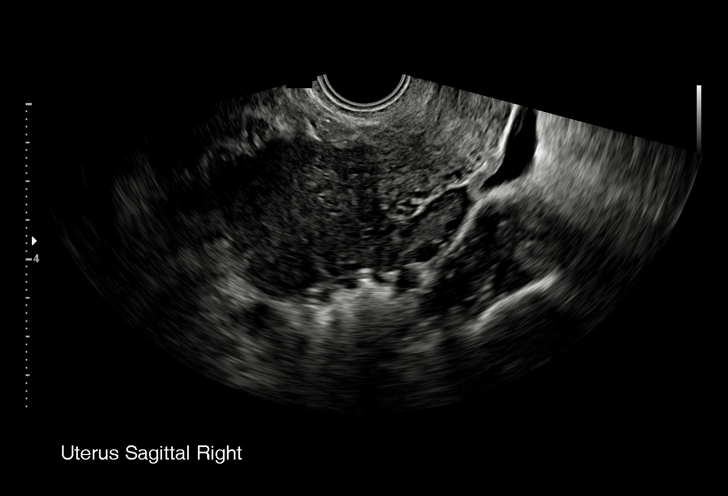
[im 12/39]
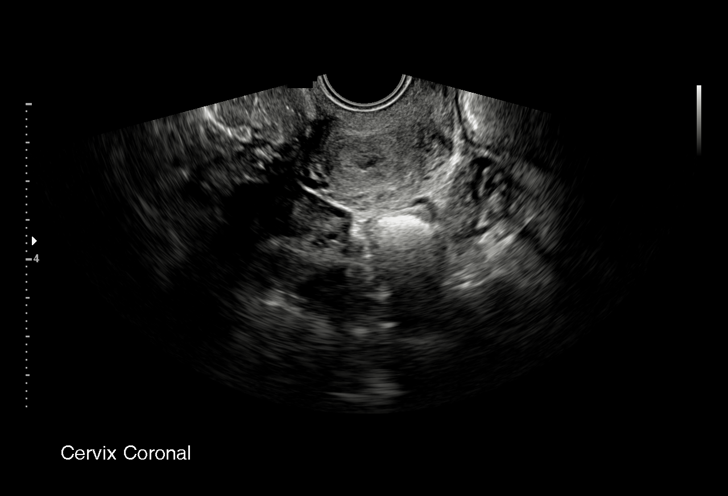
[im 15/39]
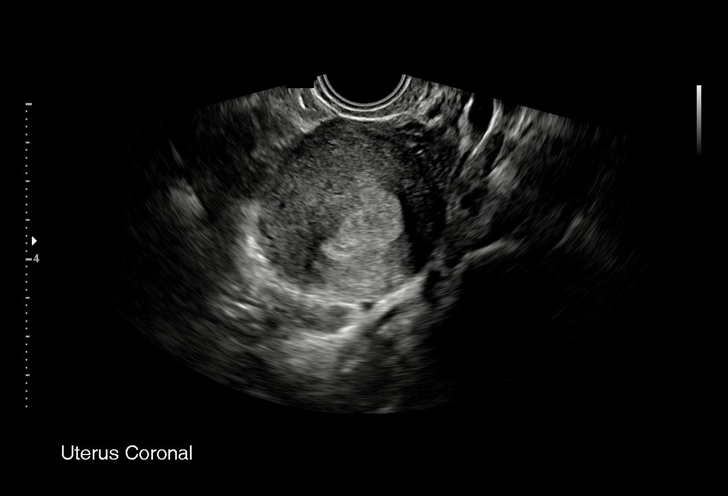
[im 17/39]
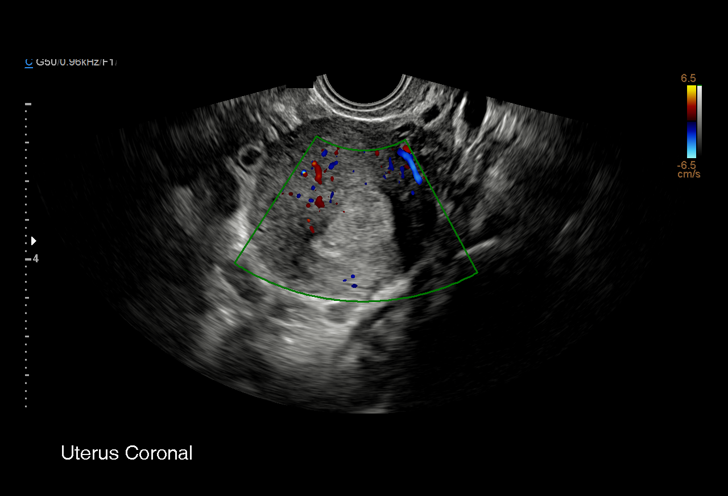
[im 20/39]
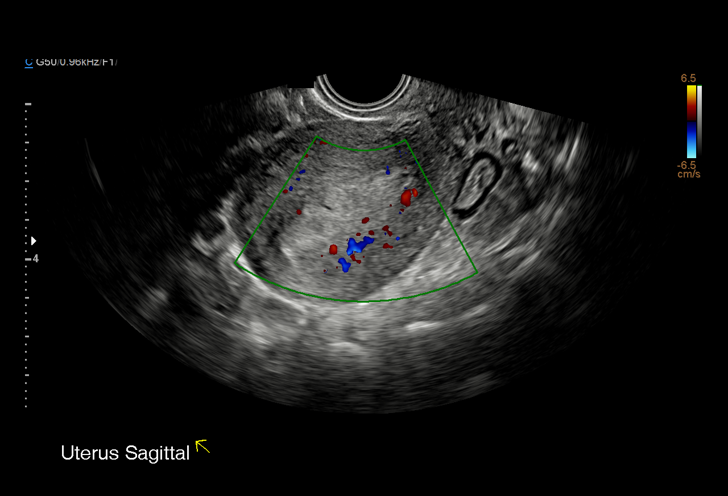
[im 22/39]
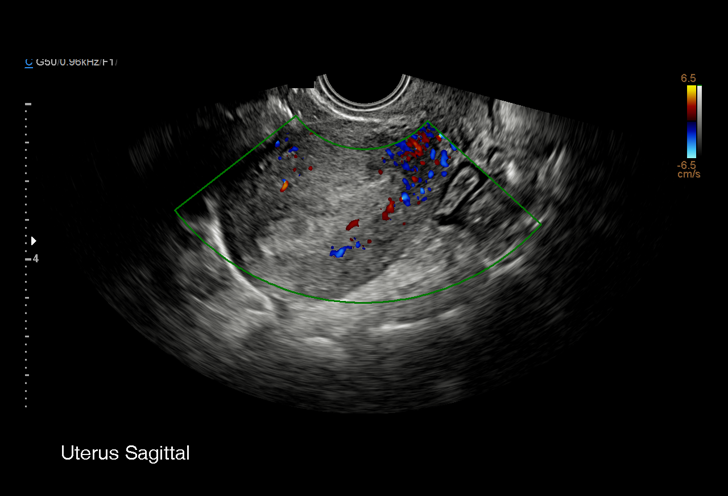
[im 24/39]
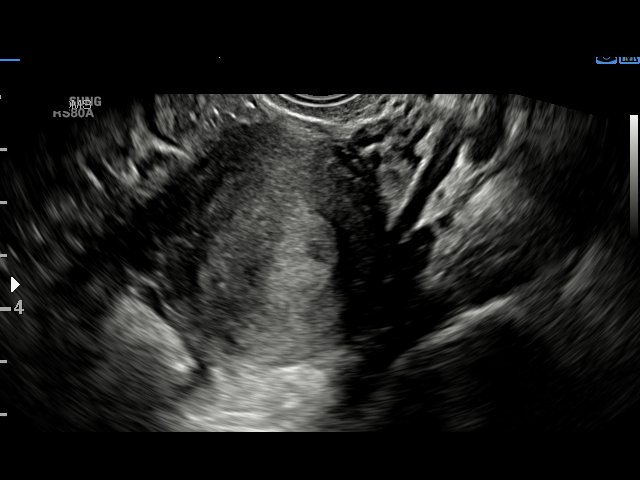
[im 27/39]
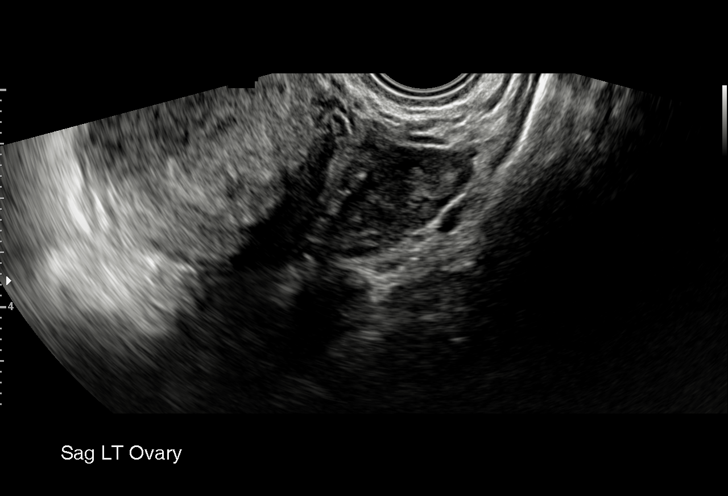
[im 30/39]
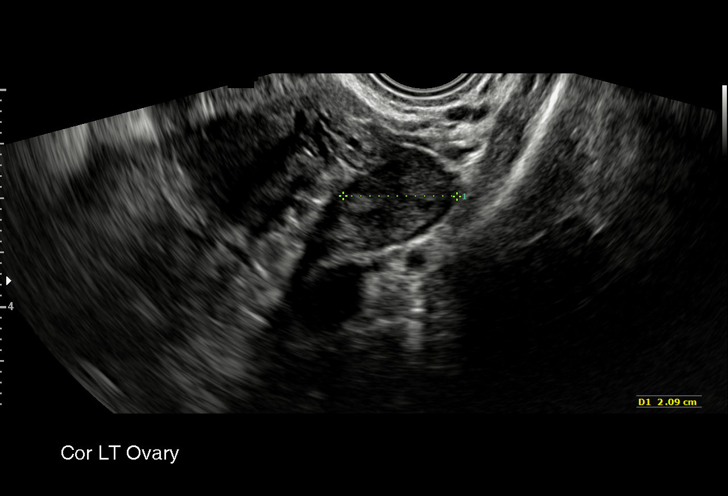
[im 33/39]
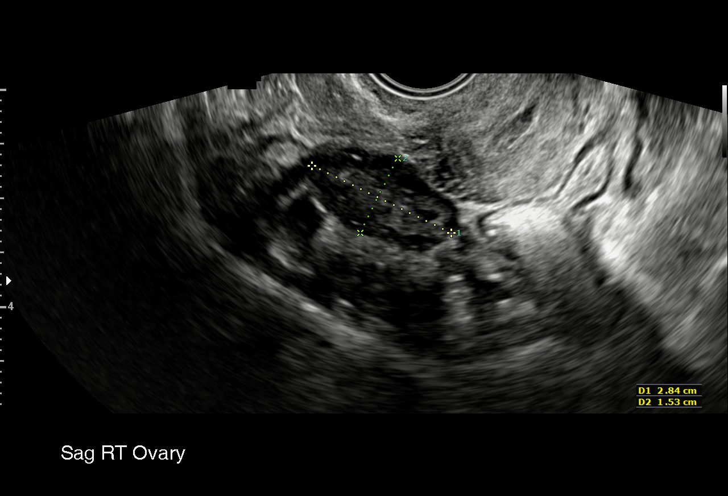
[im 36/39]
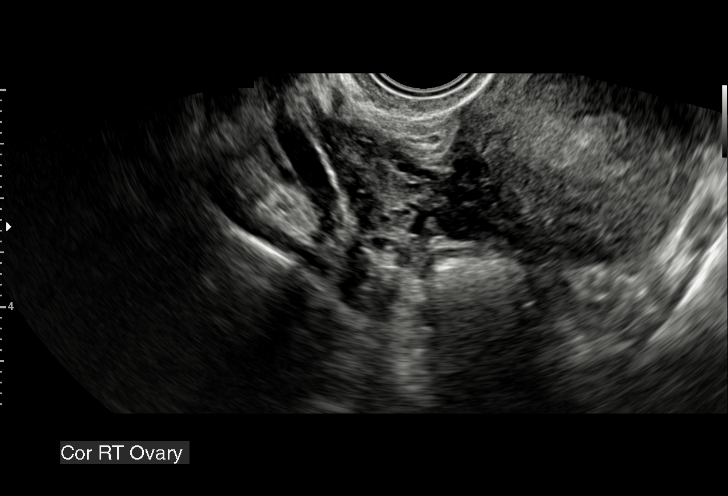
[im 39/39]
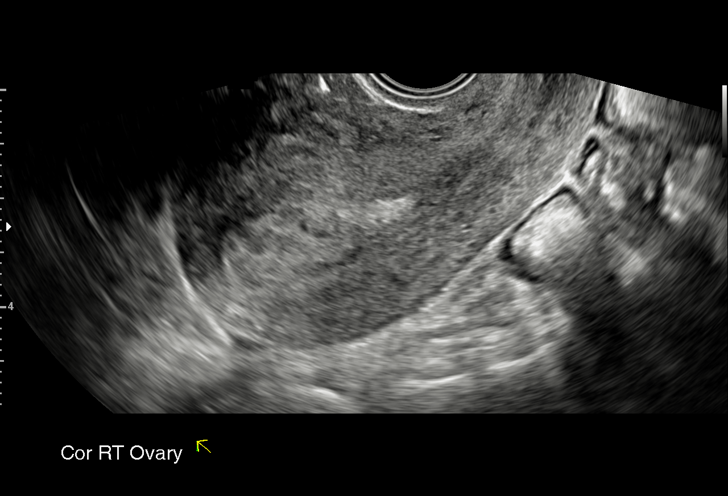

[15 of 28 positions shown; findings below may reference images not displayed]

FINDINGS: Intrauterine gestational sac: No intrauterine gestational sac
identified today. The gestational sac seen on the previous study is
no longer present suggesting interval loss of pregnancy.

Yolk sac:  Not identified.

Embryo:  Not identified.

Cardiac Activity: Not identified.

Maternal uterus/adnexae: Uterus is mildly anteverted. No myometrial
mass lesions identified. Endometrial stripe thickness measures about
1.2 cm. No endometrial fluid collections. No significant flow is
demonstrated within the endometrium on color flow Doppler imaging.
Both ovaries are visualized and are normal. No abnormal pelvic fluid
collections.
IMPRESSION: Intrauterine gestational sac seen on previous study is no longer
demonstrated today consistent with interval loss of pregnancy.

## 2018-10-31 MED ORDER — WITCH HAZEL-GLYCERIN EX PADS: 1.0000 "application " | MEDICATED_PAD | CUTANEOUS | Status: DC | PRN

## 2018-10-31 MED ORDER — DIPHENHYDRAMINE HCL 25 MG PO CAPS: 25.0000 mg | ORAL_CAPSULE | Freq: Four times a day (QID) | ORAL | Status: DC | PRN

## 2018-10-31 MED ORDER — OXYCODONE HCL 5 MG PO TABS: 5.0000 mg | ORAL_TABLET | ORAL | Status: DC | PRN

## 2018-10-31 MED ORDER — TETANUS-DIPHTH-ACELL PERTUSSIS 5-2.5-18.5 LF-MCG/0.5 IM SUSP: 0.5000 mL | Freq: Once | INTRAMUSCULAR | Status: DC

## 2018-10-31 MED ORDER — METHYLERGONOVINE MALEATE 0.2 MG/ML IJ SOLN: 0.2000 mg | INTRAMUSCULAR | Status: DC | PRN

## 2018-10-31 MED ORDER — DIBUCAINE 1 % RE OINT: 1.0000 "application " | TOPICAL_OINTMENT | RECTAL | Status: DC | PRN

## 2018-10-31 MED ORDER — COCONUT OIL OIL: 1.0000 "application " | TOPICAL_OIL | Status: DC | PRN

## 2018-10-31 MED ORDER — SENNOSIDES-DOCUSATE SODIUM 8.6-50 MG PO TABS
2.0000 | ORAL_TABLET | ORAL | Status: DC
  Administered 2018-10-31: 2 via ORAL
  Filled 2018-10-31: qty 2

## 2018-10-31 MED ORDER — BENZOCAINE-MENTHOL 20-0.5 % EX AERO
1.0000 "application " | INHALATION_SPRAY | CUTANEOUS | Status: DC | PRN
  Administered 2018-10-31: 1 via TOPICAL
  Filled 2018-10-31: qty 56

## 2018-10-31 MED ORDER — ONDANSETRON HCL 4 MG PO TABS: 4.0000 mg | ORAL_TABLET | ORAL | Status: DC | PRN

## 2018-10-31 MED ORDER — PRENATAL MULTIVITAMIN CH
1.0000 | ORAL_TABLET | Freq: Every day | ORAL | Status: DC
  Administered 2018-10-31 – 2018-11-01 (×2): 1 via ORAL
  Filled 2018-10-31 (×2): qty 1

## 2018-10-31 MED ORDER — MAGNESIUM HYDROXIDE 400 MG/5ML PO SUSP: 30.0000 mL | ORAL | Status: DC | PRN

## 2018-10-31 MED ORDER — IBUPROFEN 600 MG PO TABS
600.0000 mg | ORAL_TABLET | Freq: Four times a day (QID) | ORAL | Status: DC
  Administered 2018-10-31 – 2018-11-01 (×6): 600 mg via ORAL
  Filled 2018-10-31 (×6): qty 1

## 2018-10-31 MED ORDER — ONDANSETRON HCL 4 MG/2ML IJ SOLN: 4.0000 mg | INTRAMUSCULAR | Status: DC | PRN

## 2018-10-31 MED ORDER — ZOLPIDEM TARTRATE 5 MG PO TABS: 5.0000 mg | ORAL_TABLET | Freq: Every evening | ORAL | Status: DC | PRN

## 2018-10-31 MED ORDER — OXYCODONE HCL 5 MG PO TABS: 10.0000 mg | ORAL_TABLET | ORAL | Status: DC | PRN

## 2018-10-31 MED ORDER — OXYTOCIN 40 UNITS IN LACTATED RINGERS INFUSION - SIMPLE MED: 2.5000 [IU]/h | INTRAVENOUS | Status: DC | PRN

## 2018-10-31 MED ORDER — SIMETHICONE 80 MG PO CHEW
80.0000 mg | CHEWABLE_TABLET | ORAL | Status: DC | PRN
  Filled 2018-10-31: qty 1

## 2018-10-31 MED ORDER — ACETAMINOPHEN 325 MG PO TABS: 650.0000 mg | ORAL_TABLET | ORAL | Status: DC | PRN

## 2018-10-31 MED ORDER — METHYLERGONOVINE MALEATE 0.2 MG PO TABS: 0.2000 mg | ORAL_TABLET | ORAL | Status: DC | PRN

## 2018-10-31 NOTE — Progress Notes (Signed)
Postpartum day #1, NSVD  Subjective Pt without complaints.  Lochia normal, seem heavy.  Pain controlled.  Breast feeding yes  Temp:  [97.2 F (36.2 C)-101.1 F (38.4 C)] 98.5 F (36.9 C) (12/10 0554) Pulse Rate:  [61-104] 64 (12/10 0554) Resp:  [16-18] 16 (12/10 0029) BP: (85-131)/(49-96) 101/64 (12/10 0554) SpO2:  [98 %-99 %] 99 % (12/10 0054)  Gen:  NAD, A&O x 3 Uterine fundus:  Could not feel fundus well,?boggy Lochia normal Ext:  +Edema, no calf tenderness bilaterally  CBC    Component Value Date/Time   WBC 17.9 (H) 10/31/2018 0522   RBC 3.30 (L) 10/31/2018 0522   HGB 10.3 (L) 10/31/2018 0522   HCT 30.7 (L) 10/31/2018 0522   PLT 164 10/31/2018 0522   MCV 93.0 10/31/2018 0522   MCH 31.2 10/31/2018 0522   MCHC 33.6 10/31/2018 0522   RDW 13.6 10/31/2018 0522     A/P: S/p SVD doing well. Boggy uterus.  Cytotec PR. Routine postpartum care. Lactation support. Discharge in am.  Shelly Allen 10/31/2018, 8:40 AM

## 2018-10-31 NOTE — Lactation Note (Signed)
This note was copied from a baby's chart. Lactation Consultation Note  Patient Name: Shelly Allen: 10/31/2018 Reason for consult: Initial assessment;Difficult latch;1st time breastfeeding;Primapara;Term  P1 mother whose infant is now 4313 hours old  RN assisting with latching when I entered.  I offered to assist with latching and mother accepted.  Per mom, baby was showing feeding cues but now does not appear to be interested in latching.  When attempting to obtain a wide gape, baby only interested in licking the nipple and showed no interest in opening mouth.  With gentle stimulation she was able to open and latch but refused to suck.  Allowed time for gentle stimulation and attempted again with the same results.  Burped baby and attempted to latch in the football hold on the right breast.  Again, baby not interested.  Encouraged mom and dad to do STS and continue to watch for feeding cues.    Assisted mother to do hand expression and she obtained a few drops of colostrum which I finger fed back to baby.  Encouraged to feed 8-12 times/24 hours or sooner if baby shows cues.  Reviewed feeding cues.  Mother will do hand expression before/after feedings to help increase milk supply.  Spoon feeding discussed for when mother is able to obtain more colostrum.  Mother will call for latch assistance as needed.  Mom made aware of O/P services, breastfeeding support groups, community resources, and our phone # for post-discharge questions. Demonstrated swaddling for parents.  Parents very receptive to learning and appreciative of care given.   Maternal Data Formula Feeding for Exclusion: No Has patient been taught Hand Expression?: Yes Does the patient have breastfeeding experience prior to this delivery?: No  Feeding Feeding Type: Breast Fed  LATCH Score Latch: Repeated attempts needed to sustain latch, nipple held in mouth throughout feeding, stimulation needed to elicit sucking  reflex.  Audible Swallowing: None  Type of Nipple: Everted at rest and after stimulation  Comfort (Breast/Nipple): Soft / non-tender  Hold (Positioning): Assistance needed to correctly position infant at breast and maintain latch.  LATCH Score: 6  Interventions Interventions: Breast feeding basics reviewed;Assisted with latch;Skin to skin;Breast massage;Hand express;Breast compression;Adjust position;Position options;Support pillows  Lactation Tools Discussed/Used     Consult Status Consult Status: Follow-up Allen: 11/01/18 Follow-up type: In-patient    Dora SimsBeth R Brannen Koppen 10/31/2018, 12:28 PM

## 2018-10-31 NOTE — Progress Notes (Signed)
CSW received consult for MOB having history of ADHD- after chart review no concerns were listed in OBGYN's notes.   Please reconsult for any other needs/ if MOB scores 9 or higher on [place]  Ronel Rodeheaver, LCSW Clinical Social Worker  [employer]  [phone]  

## 2018-10-31 NOTE — Anesthesia Postprocedure Evaluation (Signed)
Anesthesia Post Note  Patient: Shelly Allen  Procedure(s) Performed: AN AD HOC LABOR EPIDURAL     Patient location during evaluation: Mother Baby Level of consciousness: awake and alert Pain management: pain level controlled Vital Signs Assessment: post-procedure vital signs reviewed and stable Respiratory status: spontaneous breathing Cardiovascular status: stable Postop Assessment: no headache, adequate PO intake, no backache, patient able to bend at knees, able to ambulate, epidural receding and no apparent nausea or vomiting Anesthetic complications: no    Last Vitals:  Vitals:   10/31/18 0227 10/31/18 0554  BP: 97/60 101/64  Pulse: 66 64  Resp:    Temp: 36.7 C 36.9 C  SpO2:      Last Pain:  Vitals:   10/31/18 0554  TempSrc: Oral  PainSc:    Pain Goal:                 [name], [name]

## 2018-11-01 MED ORDER — ALBUTEROL SULFATE (2.5 MG/3ML) 0.083% IN NEBU
2.5000 mg | INHALATION_SOLUTION | RESPIRATORY_TRACT | Status: DC | PRN
  Administered 2018-11-01: 2.5 mg via RESPIRATORY_TRACT
  Filled 2018-11-01: qty 3

## 2018-11-01 MED ORDER — IBUPROFEN 600 MG PO TABS: 600.0000 mg | ORAL_TABLET | Freq: Four times a day (QID) | ORAL | 0 refills | Status: DC

## 2018-11-01 NOTE — Discharge Instructions (Signed)
Vaginal Delivery, Care After °Refer to this sheet in the next few weeks. These instructions provide you with information about caring for yourself after vaginal delivery. Your health care provider may also give you more specific instructions. Your treatment has been planned according to current medical practices, but problems sometimes occur. Call your health care provider if you have any problems or questions. °What can I expect after the procedure? °After vaginal delivery, it is common to have: °· Some bleeding from your vagina. °· Soreness in your abdomen, your vagina, and the area of skin between your vaginal opening and your anus (perineum). °· Pelvic cramps. °· Fatigue. ° °Follow these instructions at home: °Medicines °· Take over-the-counter and prescription medicines only as told by your health care provider. °· If you were prescribed an antibiotic medicine, take it as told by your health care provider. Do not stop taking the antibiotic until it is finished. °Driving ° °· Do not drive or operate heavy machinery while taking prescription pain medicine. °· Do not drive for 24 hours if you received a sedative. °Lifestyle °· Do not drink alcohol. This is especially important if you are breastfeeding or taking medicine to relieve pain. °· Do not use tobacco products, including cigarettes, chewing tobacco, or e-cigarettes. If you need help quitting, ask your health care provider. °Eating and drinking °· Drink at least 8 eight-ounce glasses of water every day unless you are told not to by your health care provider. If you choose to breastfeed your baby, you may need to drink more water than this. °· Eat high-fiber foods every day. These foods may help prevent or relieve constipation. High-fiber foods include: °? Whole grain cereals and breads. °? Brown rice. °? Beans. °? Fresh fruits and vegetables. °Activity °· Return to your normal activities as told by your health care provider. Ask your health care provider  what activities are safe for you. °· Rest as much as possible. Try to rest or take a nap when your baby is sleeping. °· Do not lift anything that is heavier than your baby or 10 lb (4.5 kg) until your health care provider says that it is safe. °· Talk with your health care provider about when you can engage in sexual activity. This may depend on your: °? Risk of infection. °? Rate of healing. °? Comfort and desire to engage in sexual activity. °Vaginal Care °· If you have an episiotomy or a vaginal tear, check the area every day for signs of infection. Check for: °? More redness, swelling, or pain. °? More fluid or blood. °? Warmth. °? Pus or a bad smell. °· Do not use tampons or douches until your health care provider says this is safe. °· Watch for any blood clots that may pass from your vagina. These may look like clumps of dark red, brown, or black discharge. °General instructions °· Keep your perineum clean and dry as told by your health care provider. °· Wear loose, comfortable clothing. °· Wipe from front to back when you use the toilet. °· Ask your health care provider if you can shower or take a bath. If you had an episiotomy or a perineal tear during labor and delivery, your health care provider may tell you not to take baths for a certain length of time. °· Wear a bra that supports your breasts and fits you well. °· If possible, have someone help you with household activities and help care for your baby for at least a few days after   you leave the hospital. °· Keep all follow-up visits for you and your baby as told by your health care provider. This is important. °Contact a health care provider if: °· You have: °? Vaginal discharge that has a bad smell. °? Difficulty urinating. °? Pain when urinating. °? A sudden increase or decrease in the frequency of your bowel movements. °? More redness, swelling, or pain around your episiotomy or vaginal tear. °? More fluid or blood coming from your episiotomy or  vaginal tear. °? Pus or a bad smell coming from your episiotomy or vaginal tear. °? A fever. °? A rash. °? Little or no interest in activities you used to enjoy. °? Questions about caring for yourself or your baby. °· Your episiotomy or vaginal tear feels warm to the touch. °· Your episiotomy or vaginal tear is separating or does not appear to be healing. °· Your breasts are painful, hard, or turn red. °· You feel unusually sad or worried. °· You feel nauseous or you vomit. °· You pass large blood clots from your vagina. If you pass a blood clot from your vagina, save it to show to your health care provider. Do not flush blood clots down the toilet without having your health care provider look at them. °· You urinate more than usual. °· You are dizzy or light-headed. °· You have not breastfed at all and you have not had a menstrual period for 12 weeks after delivery. °· You have stopped breastfeeding and you have not had a menstrual period for 12 weeks after you stopped breastfeeding. °Get help right away if: °· You have: °? Pain that does not go away or does not get better with medicine. °? Chest pain. °? Difficulty breathing. °? Blurred vision or spots in your vision. °? Thoughts about hurting yourself or your baby. °· You develop pain in your abdomen or in one of your legs. °· You develop a severe headache. °· You faint. °· You bleed from your vagina so much that you fill two sanitary pads in one hour. °This information is not intended to replace advice given to you by your health care provider. Make sure you discuss any questions you have with your health care provider. °Document Released: 11/05/2000 Document Revised: 04/21/2016 Document Reviewed: 11/23/2015 °Elsevier Interactive Patient Education © 2018 [employer] ° °

## 2018-11-01 NOTE — Lactation Note (Signed)
This note was copied from a baby's chart. Lactation Consultation Note  Patient Name: Shelly Allen MVHQI'OToday's Date: 11/01/2018   Female baby Shelly "Arna Mediciora" is cuing on arrival.  Infant has had good voids,stools, and minimal weight loss.  However, infant still struggling to latch well, especially to moms right breast.  Asked mom if I could help with latching on right breast. Mom agreed.  After repeated attempts got Shelly Allen manual breast pump and showed mom how to massage and do reverse pressure softening on right breast.  Infant latches and will stay a few more minutes but then comes off.  Urged mom to keep working with her.  Urged sts until she is feeding well and back up to bw. Urged hand expression and spoon feeding back all EBM past breastfeedings until infant breastfeeding well.  Explained to mom they may have a different plan once sees pediatrician.  Urged mom to pump on right breast if infant does not latch.  Mom has Spectra pump at home for use and a haka pump she reported.  Reminded mom Haka was really not a pump, only to catch excess milk. Urged mom to follow up with outpatient lactation as needed.  Mom has handouts with breastfeeding resources available as needed.  Maternal Data    Feeding    LATCH Score                   Interventions    Lactation Tools Discussed/Used     Consult Status      Shelly Allen 11/01/2018, 10:47 AM

## 2018-11-01 NOTE — Discharge Summary (Signed)
OB Discharge Summary     Patient Name: Shelly Allen DOB: [birth year] MRN: [id number]  Date of admission: 10/30/2018 Delivering MD: Geryl RankinsVARNADO, Leydy Worthey   Date of discharge: 11/01/2018  Admitting diagnosis: INDUCTION for postdates Intrauterine pregnancy: [id number]     Secondary diagnosis:  Active Problems:   Supervision of normal pregnancy  Additional problems: None     Discharge diagnosis: Term Pregnancy Delivered                                                                                                Post partum procedures:None  Augmentation: Pitocin, Cytotec and Foley Balloon  Complications: None  Hospital course:  Induction of Labor With Vaginal Delivery   32 y.o. yo G2P1011 at 7757w4d was admitted to the hospital 10/30/2018 for induction of labor.  Indication for induction: Postdates.  Patient had an uncomplicated labor course as follows: Membrane Rupture Time/Date: 12:47 PM ,10/30/2018   Intrapartum Procedures: Episiotomy:                                           Lacerations:  Periurethral [8];Vaginal [6]  Patient had delivery of a Viable infant.  Information for the patient's newborn:  Karie Georgesatterson, Girl Jakiah [[id number]][[id number]]  Delivery Method: Vaginal, Spontaneous(Filed from Delivery Summary)   10/30/2018  Details of delivery can be found in separate delivery note.  Patient had a routine postpartum course. Patient is discharged home 11/01/18.  Physical exam  Vitals:   10/31/18 0554 10/31/18 1240 10/31/18 1640 10/31/18 2308  BP: 101/64 (!) 94/51 (!) 94/55 103/60  Pulse: 64 (!) 59 68 72  Resp:  18 16 18   Temp: 98.5 F (36.9 C) 98.5 F (36.9 C) 97.6 F (36.4 C) 98.5 F (36.9 C)  TempSrc: Oral Oral Axillary Oral  SpO2:      Weight:      Height:       General: alert, cooperative and no distress Lochia: appropriate Uterine Fundus: firm Incision: Healing well with no significant drainage DVT Evaluation: No evidence of DVT seen on physical exam. Calf/Ankle edema is  present Labs: Lab Results  Component Value Date   WBC 17.9 (H) 10/31/2018   HGB 10.3 (L) 10/31/2018   HCT 30.7 (L) 10/31/2018   MCV 93.0 10/31/2018   PLT 164 10/31/2018   No flowsheet data found.  Discharge instruction: per After Visit Summary and "Baby and Me Booklet".  After visit meds:  Allergies as of 11/01/2018   No Known Allergies     Medication List    TAKE these medications   calcium carbonate 500 MG chewable tablet Commonly known as:  TUMS - dosed in mg elemental calcium Chew 2 tablets by mouth daily as needed for indigestion or heartburn.   ibuprofen 600 MG tablet Commonly known as:  ADVIL,MOTRIN Take 1 tablet (600 mg total) by mouth every 6 (six) hours.   multivitamin-prenatal 27-0.8 MG Tabs tablet Take 1 tablet by mouth daily at 12 noon.       Diet: routine  diet  Activity: Advance as tolerated. Pelvic rest for 6 weeks.   Outpatient follow up:6 weeks Follow up Appt:No future appointments. Follow up Visit:No follow-ups on file.  Postpartum contraception: Discussed  Newborn Data: Live born female  Birth Weight: 7 lb 14.2 oz (3578 g) APGAR: 8, 9  Newborn Delivery   Birth date/time:  10/30/2018 22:43:00 Delivery type:  Vaginal, Spontaneous     Baby Feeding: Breast Disposition:home with mother   11/01/2018 Geryl Rankins, MD

## 2018-11-01 NOTE — Progress Notes (Signed)
Pt with complaint of changes in her breathing.  She has a h/o Asthma.  Last attack was at 4029 [place] Prior to that she was a child.   She can breath in but it feels ?strained at the end of expiration if she breaths out deeply. Pt is not audibly wheezing.  Denies tachycardia.  No signs of DVT. Albuterol 2 puffs now. Pt instructed to inform RN if no improvement.

## 2018-12-07 ENCOUNTER — Ambulatory Visit: Payer: Self-pay

## 2018-12-07 NOTE — Lactation Note (Signed)
This note was copied from a baby's chart. Lactation Consultation Note  Patient Name: Shelly Allen [id number] Date: 12/07/2018     Maternal Data    Feeding    LATCH Score                   Interventions    Lactation Tools Discussed/Used     Consult Status  Baby Shelly Allen and mother Shelly Allen) are here today for a lactation outpatient consultation. Mother states that Shelly Allen is now 69 weeks old and has recently had a lip revision about 2-3 weeks ago. Shelly Allen's tongue was not revised. Mother states that breastfeeding is still painful after the revision and she was developing blisters, so she had stopped breastfeeding to take a break so her nipples could heal. Shelly Allen has since been bottle feeding. A weight was obtained before and after the feeding. LC assisted mother with correct positioning using pillows and a blanket to position Shelly Allen high enough so she could reach her breast in the cradle position. LC helped sandwich her breast to achieve a deep latch. Mother states that she initially felt pinching but after flanging Shelly Allen's lips out and sandwiching her breasts, the latch felt more comfortable. After finishing one breast, Shelly Allen was burped and transferred to the other breast. Mother was demonstrated how to position Shelly Allen for maximum comfort. Mother states that she did not feel pain on the other side after adjusting and flanging the lips. Mother's nipples were slightly flattened (pinched) afterwards but mother states that this has been the best that Shelly Allen has breast-fed.  Shelly Allen breast-fed for about 30 minutes and took 86 mL during this feeding. LC demonstrated to mother tips on positioning and the use of mouth, lip and tongue stretches to gently stretch the tissue to help achieve a deeper latch over time. LC suggested that mother continue to practice breastfeeding and positioning with Shelly Allen and supplement with her pumped breast-milk if Shelly Allen still shows signs of hunger after 30 minutes of breastfeeding.  Other suggestions included trying to catch Shelly Allen when she is displaying early hunger cues and making sure that mother is calm and comfortable, not anxious.  Mother felt encouraged after today's session and feels comfortable with this plan.    Shelly Allen 12/07/2018, 3:53 PM

## 2019-11-22 ENCOUNTER — Encounter (HOSPITAL_COMMUNITY): Payer: Self-pay | Admitting: Emergency Medicine

## 2019-11-22 ENCOUNTER — Emergency Department (HOSPITAL_COMMUNITY): Payer: Managed Care, Other (non HMO) | Admitting: Certified Registered Nurse Anesthetist

## 2019-11-22 ENCOUNTER — Observation Stay (HOSPITAL_COMMUNITY)
Admission: EM | Admit: 2019-11-22 | Discharge: 2019-11-23 | Disposition: A | Discharge status: Home / Self Care | Payer: Managed Care, Other (non HMO) | Attending: General Surgery | Admitting: General Surgery

## 2019-11-22 ENCOUNTER — Encounter (HOSPITAL_COMMUNITY): Admission: EM | Disposition: A | Discharge status: Home / Self Care | Payer: Self-pay | Source: Home / Self Care

## 2019-11-22 ENCOUNTER — Other Ambulatory Visit: Payer: Self-pay

## 2019-11-22 DIAGNOSIS — K353 Acute appendicitis with localized peritonitis, without perforation or gangrene: Principal | ICD-10-CM | POA: Insufficient documentation

## 2019-11-22 DIAGNOSIS — K37 Unspecified appendicitis: Secondary | ICD-10-CM | POA: Diagnosis present

## 2019-11-22 DIAGNOSIS — Z20828 Contact with and (suspected) exposure to other viral communicable diseases: Secondary | ICD-10-CM | POA: Diagnosis not present

## 2019-11-22 HISTORY — PX: APPENDECTOMY: SHX54

## 2019-11-22 HISTORY — DX: Unspecified asthma, uncomplicated: J45.909

## 2019-11-22 HISTORY — PX: LAPAROSCOPIC APPENDECTOMY: SHX408

## 2019-11-22 LAB — COMPREHENSIVE METABOLIC PANEL
ALT: 18 U/L (ref 0–44)
AST: 16 U/L (ref 15–41)
Albumin: 3.9 g/dL (ref 3.5–5.0)
Alkaline Phosphatase: 56 U/L (ref 38–126)
Anion gap: 13 (ref 5–15)
BUN: 7 mg/dL (ref 6–20)
CO2: 21 mmol/L — ABNORMAL LOW (ref 22–32)
Calcium: 8.7 mg/dL — ABNORMAL LOW (ref 8.9–10.3)
Chloride: 99 mmol/L (ref 98–111)
Creatinine, Ser: 0.72 mg/dL (ref 0.44–1.00)
GFR calc Af Amer: >60 mL/min (ref >60)
GFR calc non Af Amer: >60 mL/min (ref >60)
Glucose, Bld: 85 mg/dL (ref 70–99)
Potassium: 3.8 mmol/L (ref 3.5–5.1)
Sodium: 133 mmol/L — ABNORMAL LOW (ref 135–145)
Total Bilirubin: 2.3 mg/dL — ABNORMAL HIGH (ref 0.3–1.2)
Total Protein: 6.8 g/dL (ref 6.5–8.1)

## 2019-11-22 LAB — URINALYSIS, ROUTINE W REFLEX MICROSCOPIC
Bilirubin Urine: NEGATIVE
Glucose, UA: NEGATIVE mg/dL
Hgb urine dipstick: NEGATIVE
Ketones, ur: 5 mg/dL — AB
Leukocytes,Ua: NEGATIVE
Nitrite: NEGATIVE
Protein, ur: NEGATIVE mg/dL
Specific Gravity, Urine: 1.032 — ABNORMAL HIGH (ref 1.005–1.030)
pH: 6 (ref 5.0–8.0)

## 2019-11-22 LAB — RESPIRATORY PANEL BY RT PCR (FLU A&B, COVID)
Influenza A by PCR: NEGATIVE
Influenza B by PCR: NEGATIVE
SARS Coronavirus 2 by RT PCR: NEGATIVE

## 2019-11-22 LAB — CBC
HCT: 40 % (ref 36.0–46.0)
Hemoglobin: 13.3 g/dL (ref 12.0–15.0)
MCH: 30.3 pg (ref 26.0–34.0)
MCHC: 33.3 g/dL (ref 30.0–36.0)
MCV: 91.1 fL (ref 80.0–100.0)
Platelets: 213 10*3/uL (ref 150–400)
RBC: 4.39 MIL/uL (ref 3.87–5.11)
RDW: 12.4 % (ref 11.5–15.5)
WBC: 8.7 10*3/uL (ref 4.0–10.5)
nRBC: 0 % (ref 0.0–0.2)

## 2019-11-22 LAB — LIPASE, BLOOD: Lipase: 25 U/L (ref 11–51)

## 2019-11-22 LAB — I-STAT BETA HCG BLOOD, ED (MC, WL, AP ONLY): I-stat hCG, quantitative: <5 m[IU]/mL (ref <5)

## 2019-11-22 SURGERY — APPENDECTOMY, LAPAROSCOPIC
Anesthesia: General | Site: Abdomen

## 2019-11-22 MED ORDER — SODIUM CHLORIDE 0.9% FLUSH: 3.0000 mL | Freq: Once | INTRAVENOUS | Status: DC

## 2019-11-22 MED ORDER — ONDANSETRON HCL 4 MG/2ML IJ SOLN
INTRAMUSCULAR | Status: AC
  Filled 2019-11-22: qty 2

## 2019-11-22 MED ORDER — MIDAZOLAM HCL 2 MG/2ML IJ SOLN
2.0000 mg | Freq: Once | INTRAMUSCULAR | Status: AC
  Administered 2019-11-22: 2 mg via INTRAVENOUS

## 2019-11-22 MED ORDER — DEXAMETHASONE SODIUM PHOSPHATE 4 MG/ML IJ SOLN
INTRAMUSCULAR | Status: DC | PRN
  Administered 2019-11-22: 4 mg via INTRAVENOUS

## 2019-11-22 MED ORDER — PHENYLEPHRINE HCL (PRESSORS) 10 MG/ML IV SOLN
INTRAVENOUS | Status: DC | PRN
  Administered 2019-11-22: 80 ug via INTRAVENOUS

## 2019-11-22 MED ORDER — DEXAMETHASONE SODIUM PHOSPHATE 10 MG/ML IJ SOLN
INTRAMUSCULAR | Status: AC
  Filled 2019-11-22: qty 1

## 2019-11-22 MED ORDER — MIDAZOLAM HCL 2 MG/2ML IJ SOLN
INTRAMUSCULAR | Status: AC
  Filled 2019-11-22: qty 2

## 2019-11-22 MED ORDER — MENTHOL 3 MG MT LOZG
1.0000 | LOZENGE | OROMUCOSAL | Status: DC | PRN
  Administered 2019-11-23: 3 mg via ORAL
  Filled 2019-11-22: qty 9

## 2019-11-22 MED ORDER — GABAPENTIN 300 MG PO CAPS
300.0000 mg | ORAL_CAPSULE | Freq: Two times a day (BID) | ORAL | Status: DC
  Administered 2019-11-22 – 2019-11-23 (×2): 300 mg via ORAL
  Filled 2019-11-22 (×2): qty 1

## 2019-11-22 MED ORDER — ONDANSETRON 4 MG PO TBDP
4.0000 mg | ORAL_TABLET | Freq: Four times a day (QID) | ORAL | Status: DC | PRN
  Administered 2019-11-23: 4 mg via ORAL
  Filled 2019-11-22: qty 1

## 2019-11-22 MED ORDER — ALBUTEROL SULFATE (2.5 MG/3ML) 0.083% IN NEBU
2.5000 mg | INHALATION_SOLUTION | Freq: Four times a day (QID) | RESPIRATORY_TRACT | Status: DC | PRN
  Administered 2019-11-22: 2.5 mg via RESPIRATORY_TRACT

## 2019-11-22 MED ORDER — ROCURONIUM BROMIDE 100 MG/10ML IV SOLN
INTRAVENOUS | Status: DC | PRN
  Administered 2019-11-22: 40 mg via INTRAVENOUS

## 2019-11-22 MED ORDER — LIDOCAINE HCL (CARDIAC) PF 100 MG/5ML IV SOSY
PREFILLED_SYRINGE | INTRAVENOUS | Status: DC | PRN
  Administered 2019-11-22: 50 mg via INTRAVENOUS

## 2019-11-22 MED ORDER — SODIUM CHLORIDE 0.9 % IR SOLN
Status: DC | PRN
  Administered 2019-11-22: 1000 mL

## 2019-11-22 MED ORDER — MORPHINE SULFATE (PF) 4 MG/ML IV SOLN: 4.0000 mg | Freq: Once | INTRAVENOUS | Status: DC

## 2019-11-22 MED ORDER — SUGAMMADEX SODIUM 200 MG/2ML IV SOLN
INTRAVENOUS | Status: DC | PRN
  Administered 2019-11-22: 200 mg via INTRAVENOUS

## 2019-11-22 MED ORDER — FENTANYL CITRATE (PF) 250 MCG/5ML IJ SOLN
INTRAMUSCULAR | Status: AC
  Filled 2019-11-22: qty 5

## 2019-11-22 MED ORDER — PROPOFOL 10 MG/ML IV BOLUS
INTRAVENOUS | Status: AC
  Filled 2019-11-22: qty 20

## 2019-11-22 MED ORDER — ONDANSETRON HCL 4 MG/2ML IJ SOLN
INTRAMUSCULAR | Status: DC | PRN
  Administered 2019-11-22: 4 mg via INTRAVENOUS

## 2019-11-22 MED ORDER — OXYCODONE HCL 5 MG PO TABS
5.0000 mg | ORAL_TABLET | ORAL | Status: DC | PRN
  Administered 2019-11-23 (×3): 10 mg via ORAL
  Filled 2019-11-22 (×3): qty 2

## 2019-11-22 MED ORDER — BUPIVACAINE-EPINEPHRINE 0.25% -1:200000 IJ SOLN
INTRAMUSCULAR | Status: DC | PRN
  Administered 2019-11-22: 10 mL

## 2019-11-22 MED ORDER — FENTANYL CITRATE (PF) 100 MCG/2ML IJ SOLN
INTRAMUSCULAR | Status: DC | PRN
  Administered 2019-11-22 (×3): 50 ug via INTRAVENOUS

## 2019-11-22 MED ORDER — ALBUTEROL SULFATE HFA 108 (90 BASE) MCG/ACT IN AERS
INHALATION_SPRAY | RESPIRATORY_TRACT | Status: AC
  Filled 2019-11-22: qty 6.7

## 2019-11-22 MED ORDER — SODIUM CHLORIDE 0.9 % IV BOLUS
1000.0000 mL | Freq: Once | INTRAVENOUS | Status: AC
  Administered 2019-11-22: 1000 mL via INTRAVENOUS

## 2019-11-22 MED ORDER — SUCCINYLCHOLINE CHLORIDE 20 MG/ML IJ SOLN
INTRAMUSCULAR | Status: DC | PRN
  Administered 2019-11-22: 40 mg via INTRAVENOUS
  Administered 2019-11-22: 100 mg via INTRAVENOUS

## 2019-11-22 MED ORDER — DIPHENHYDRAMINE HCL 50 MG/ML IJ SOLN: 25.0000 mg | Freq: Four times a day (QID) | INTRAMUSCULAR | Status: DC | PRN

## 2019-11-22 MED ORDER — 0.9 % SODIUM CHLORIDE (POUR BTL) OPTIME
TOPICAL | Status: DC | PRN
  Administered 2019-11-22: 1000 mL

## 2019-11-22 MED ORDER — METHOCARBAMOL 500 MG PO TABS: 500.0000 mg | ORAL_TABLET | Freq: Four times a day (QID) | ORAL | Status: DC | PRN

## 2019-11-22 MED ORDER — TRAMADOL HCL 50 MG PO TABS: 50.0000 mg | ORAL_TABLET | Freq: Four times a day (QID) | ORAL | Status: DC | PRN

## 2019-11-22 MED ORDER — PROPOFOL 10 MG/ML IV BOLUS
INTRAVENOUS | Status: DC | PRN
  Administered 2019-11-22: 150 mg via INTRAVENOUS
  Administered 2019-11-22: 30 mg via INTRAVENOUS
  Administered 2019-11-22: 50 mg via INTRAVENOUS

## 2019-11-22 MED ORDER — SODIUM CHLORIDE 0.9 % IV SOLN
2.0000 g | Freq: Once | INTRAVENOUS | Status: AC
  Administered 2019-11-22: 2 g via INTRAVENOUS
  Filled 2019-11-22: qty 20

## 2019-11-22 MED ORDER — BUPIVACAINE-EPINEPHRINE (PF) 0.25% -1:200000 IJ SOLN
INTRAMUSCULAR | Status: AC
  Filled 2019-11-22: qty 20

## 2019-11-22 MED ORDER — ONDANSETRON HCL 4 MG/2ML IJ SOLN: 4.0000 mg | Freq: Once | INTRAMUSCULAR | Status: DC

## 2019-11-22 MED ORDER — MENTHOL 3 MG MT LOZG
LOZENGE | OROMUCOSAL | Status: AC
  Filled 2019-11-22: qty 9

## 2019-11-22 MED ORDER — LACTATED RINGERS IV SOLN: INTRAVENOUS | Status: DC | PRN

## 2019-11-22 MED ORDER — METRONIDAZOLE IN NACL 5-0.79 MG/ML-% IV SOLN
500.0000 mg | Freq: Once | INTRAVENOUS | Status: AC
  Administered 2019-11-22: 500 mg via INTRAVENOUS
  Filled 2019-11-22: qty 100

## 2019-11-22 MED ORDER — ONDANSETRON HCL 4 MG/2ML IJ SOLN
4.0000 mg | Freq: Four times a day (QID) | INTRAMUSCULAR | Status: DC | PRN
  Filled 2019-11-22: qty 2

## 2019-11-22 MED ORDER — DIPHENHYDRAMINE HCL 25 MG PO CAPS: 25.0000 mg | ORAL_CAPSULE | Freq: Four times a day (QID) | ORAL | Status: DC | PRN

## 2019-11-22 MED ORDER — MORPHINE SULFATE (PF) 4 MG/ML IV SOLN
4.0000 mg | INTRAVENOUS | Status: DC | PRN
  Administered 2019-11-23: 4 mg via INTRAVENOUS
  Filled 2019-11-22: qty 1

## 2019-11-22 MED ORDER — ZOLPIDEM TARTRATE 5 MG PO TABS: 5.0000 mg | ORAL_TABLET | Freq: Every evening | ORAL | Status: DC | PRN

## 2019-11-22 MED ORDER — CELECOXIB 200 MG PO CAPS
200.0000 mg | ORAL_CAPSULE | Freq: Two times a day (BID) | ORAL | Status: DC
  Administered 2019-11-22 – 2019-11-23 (×2): 200 mg via ORAL
  Filled 2019-11-22 (×2): qty 1

## 2019-11-22 MED ORDER — MIDAZOLAM HCL 5 MG/5ML IJ SOLN
INTRAMUSCULAR | Status: DC | PRN
  Administered 2019-11-22: 2 mg via INTRAVENOUS

## 2019-11-22 MED ORDER — ENOXAPARIN SODIUM 40 MG/0.4ML ~~LOC~~ SOLN
40.0000 mg | SUBCUTANEOUS | Status: DC
  Administered 2019-11-23: 40 mg via SUBCUTANEOUS
  Filled 2019-11-22: qty 0.4

## 2019-11-22 SURGICAL SUPPLY — 43 items
APPLIER CLIP ROT 10 11.4 M/L (STAPLE) ×3
BLADE CLIPPER SURG (BLADE) ×3 IMPLANT
CANISTER SUCT 3000ML PPV (MISCELLANEOUS) ×3 IMPLANT
CHLORAPREP W/TINT 26 (MISCELLANEOUS) ×3 IMPLANT
CLIP APPLIE ROT 10 11.4 M/L (STAPLE) ×1 IMPLANT
COVER SURGICAL LIGHT HANDLE (MISCELLANEOUS) ×3 IMPLANT
COVER WAND RF STERILE (DRAPES) IMPLANT
CUTTER FLEX LINEAR 45M (STAPLE) ×3 IMPLANT
DERMABOND ADVANCED (GAUZE/BANDAGES/DRESSINGS) ×2
DERMABOND ADVANCED .7 DNX12 (GAUZE/BANDAGES/DRESSINGS) ×1 IMPLANT
ELECT REM PT RETURN 9FT ADLT (ELECTROSURGICAL) ×3
ELECTRODE REM PT RTRN 9FT ADLT (ELECTROSURGICAL) ×1 IMPLANT
GLOVE BIO SURGEON STRL SZ8 (GLOVE) ×3 IMPLANT
GLOVE BIOGEL PI IND STRL 8 (GLOVE) ×1 IMPLANT
GLOVE BIOGEL PI INDICATOR 8 (GLOVE) ×2
GOWN STRL REUS W/ TWL LRG LVL3 (GOWN DISPOSABLE) ×1 IMPLANT
GOWN STRL REUS W/ TWL XL LVL3 (GOWN DISPOSABLE) ×1 IMPLANT
GOWN STRL REUS W/TWL LRG LVL3 (GOWN DISPOSABLE) ×2
GOWN STRL REUS W/TWL XL LVL3 (GOWN DISPOSABLE) ×2
KIT BASIN OR (CUSTOM PROCEDURE TRAY) ×3 IMPLANT
KIT TURNOVER KIT B (KITS) ×3 IMPLANT
NEEDLE 22X1 1/2 (OR ONLY) (NEEDLE) ×3 IMPLANT
NS IRRIG 1000ML POUR BTL (IV SOLUTION) ×3 IMPLANT
PAD ARMBOARD 7.5X6 YLW CONV (MISCELLANEOUS) ×6 IMPLANT
POUCH RETRIEVAL ECOSAC 10 (ENDOMECHANICALS) ×1 IMPLANT
POUCH RETRIEVAL ECOSAC 10MM (ENDOMECHANICALS) ×2
RELOAD 45 VASCULAR/THIN (ENDOMECHANICALS) ×3 IMPLANT
RELOAD STAPLE TA45 3.5 REG BLU (ENDOMECHANICALS) IMPLANT
SCISSORS LAP 5X35 DISP (ENDOMECHANICALS) IMPLANT
SET IRRIG TUBING LAPAROSCOPIC (IRRIGATION / IRRIGATOR) ×3 IMPLANT
SET TUBE SMOKE EVAC HIGH FLOW (TUBING) ×3 IMPLANT
SHEARS HARMONIC ACE PLUS 36CM (ENDOMECHANICALS) ×3 IMPLANT
SPECIMEN JAR SMALL (MISCELLANEOUS) ×3 IMPLANT
SUT VIC AB 4-0 PS2 27 (SUTURE) ×3 IMPLANT
TOWEL GREEN STERILE (TOWEL DISPOSABLE) IMPLANT
TOWEL GREEN STERILE FF (TOWEL DISPOSABLE) ×3 IMPLANT
TRAY FOLEY W/BAG SLVR 16FR (SET/KITS/TRAYS/PACK)
TRAY FOLEY W/BAG SLVR 16FR ST (SET/KITS/TRAYS/PACK) IMPLANT
TRAY LAPAROSCOPIC MC (CUSTOM PROCEDURE TRAY) ×3 IMPLANT
TROCAR XCEL 12X100 BLDLESS (ENDOMECHANICALS) ×3 IMPLANT
TROCAR XCEL BLUNT TIP 100MML (ENDOMECHANICALS) ×3 IMPLANT
TROCAR XCEL NON-BLD 5MMX100MML (ENDOMECHANICALS) ×3 IMPLANT
WATER STERILE IRR 1000ML POUR (IV SOLUTION) ×3 IMPLANT

## 2019-11-22 NOTE — H&P (Signed)
Shelly Allen is an 33 y.o. female.   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  She had a small bowel movement but the pain persisted.  It became more focused in the right lower quadrant.  She had associated nausea but no vomiting.  She ate breakfast this morning.  Her primary care physician at [facility] sent her for an outpatient CT scan of the abdomen and pelvis.  This was done at [facility] and revealed acute appendicitis.  She was referred to the emergency department.  At this time, she still complains of pain in her right lower quadrant.  Past Medical History:  Diagnosis Date  . ADHD     Past Surgical History:  Procedure Laterality Date  . NO PAST SURGERIES      Family History  Problem Relation Age of Onset  . Breast cancer Maternal Grandmother   . Diabetes Maternal Grandmother   . Cancer Maternal Grandmother    Social History:  reports that she has never smoked. She has never used smokeless tobacco. She reports that she does not drink alcohol or use drugs.  Allergies: No Known Allergies  (Not in a hospital admission)   Results for orders placed or performed during the hospital encounter of 11/22/19 (from the past 48 hour(s))  Urinalysis, Routine w reflex microscopic     Status: Abnormal   Collection Time: 11/22/19  5:14 PM  Result Value Ref Range   Color, Urine COLORLESS (A) YELLOW   APPearance CLEAR CLEAR   Specific Gravity, Urine 1.032 (H) 1.005 - 1.030   pH 6.0 5.0 - 8.0   Glucose, UA NEGATIVE NEGATIVE mg/dL   Hgb urine dipstick NEGATIVE NEGATIVE   Bilirubin Urine NEGATIVE NEGATIVE   Ketones, ur 5 (A) NEGATIVE mg/dL   Protein, ur NEGATIVE NEGATIVE mg/dL   Nitrite NEGATIVE NEGATIVE   Leukocytes,Ua NEGATIVE NEGATIVE    Comment: Performed at [facility], [address] [place], [place] [address]  Lipase, blood     Status: None   Collection Time: 11/22/19  5:27 PM  Result Value Ref Range   Lipase 25 11 - 51 U/L   Comment: Performed at [facility], [address], [place], [place] [address]  Comprehensive metabolic panel     Status: Abnormal   Collection Time: 11/22/19  5:27 PM  Result Value Ref Range   Sodium 133 (L) 135 - 145 mmol/L   Potassium 3.8 3.5 - 5.1 mmol/L   Chloride 99 98 - 111 mmol/L   CO2 21 (L) 22 - 32 mmol/L   Glucose, Bld 85 70 - 99 mg/dL   BUN 7 6 - 20 mg/dL   Creatinine, Ser 9.37 0.44 - 1.00 mg/dL   Calcium 8.7 (L) 8.9 - 10.3 mg/dL   Total Protein 6.8 6.5 - 8.1 g/dL   Albumin 3.9 3.5 - 5.0 g/dL   AST 16 15 - 41 U/L   ALT 18 0 - 44 U/L   Alkaline Phosphatase 56 38 - 126 U/L   Total Bilirubin 2.3 (H) 0.3 - 1.2 mg/dL   GFR calc non Af Amer >60 >60 mL/min   GFR calc Af Amer >60 >60 mL/min   Anion gap 13 5 - 15    Comment: Performed at [facility], [address] [place], [place] [address]  CBC     Status: None   Collection Time: 11/22/19  5:27 PM  Result Value Ref Range   WBC 8.7 4.0 -  10.5 K/uL   RBC 4.39 3.87 - 5.11 MIL/uL   Hemoglobin 13.3 12.0 - 15.0 g/dL   HCT 30.840.0 65.736.0 - 84.646.0 %   MCV 91.1 80.0 - 100.0 fL   MCH 30.3 26.0 - 34.0 pg   MCHC 33.3 30.0 - 36.0 g/dL   RDW 96.212.4 95.211.5 - 84.115.5 %   Platelets 213 150 - 400 K/uL   nRBC 0.0 0.0 - 0.2 %    Comment: Performed at [facility], [address] [place], [place] [address]  I-Stat beta hCG blood, ED     Status: None   Collection Time: 11/22/19  5:41 PM  Result Value Ref Range   I-stat hCG, quantitative <5.0 <5 mIU/mL   Comment 3            Comment:   GEST. AGE      CONC.  (mIU/mL)   <=1 WEEK        5 - 50     2 WEEKS       50 - 500     3 WEEKS       100 - 10,000     4 WEEKS     1,000 - 30,000        FEMALE AND NON-PREGNANT FEMALE:     LESS THAN 5 mIU/mL   Respiratory Panel by RT PCR (Flu A&B, Covid) - Nasopharyngeal Swab     Status: None   Collection Time: 11/22/19  6:23 PM   Specimen: Nasopharyngeal Swab  Result Value Ref Range   SARS Coronavirus 2 by RT PCR NEGATIVE NEGATIVE    Comment:  (NOTE) SARS-CoV-2 target nucleic acids are NOT DETECTED. The SARS-CoV-2 RNA is generally detectable in upper respiratoy specimens during the acute phase of infection. The lowest concentration of SARS-CoV-2 viral copies this assay can detect is 131 copies/mL. A negative result does not preclude SARS-Cov-2 infection and should not be used as the sole basis for treatment or other patient management decisions. A negative result may occur with  improper specimen collection/handling, submission of specimen other than nasopharyngeal swab, presence of viral mutation(s) within the areas targeted by this assay, and inadequate number of viral copies (<131 copies/mL). A negative result must be combined with clinical observations, patient history, and epidemiological information. The expected result is Negative. Fact Sheet for Patients:  https://www.moore.com/https://www.fda.gov/media/142436/download Fact Sheet for Healthcare Providers:  https://www.young.biz/https://www.fda.gov/media/142435/download This test is not yet ap proved or cleared by the [place] FDA and  has been authorized for detection and/or diagnosis of SARS-CoV-2 by FDA under an Emergency Use Authorization (EUA). This EUA will remain  in effect (meaning this test can be used) for the duration of the COVID-19 declaration under Section 564(b)(1) of the Act, 21 U.S.C. section 360bbb-3(b)(1), unless the authorization is terminated or revoked sooner.    Influenza A by PCR NEGATIVE NEGATIVE   Influenza B by PCR NEGATIVE NEGATIVE    Comment: (NOTE) The Xpert Xpress SARS-CoV-2/FLU/RSV assay is intended as an aid in  the diagnosis of influenza from Nasopharyngeal swab specimens and  should not be used as a sole basis for treatment. Nasal washings and  aspirates are unacceptable for Xpert Xpress SARS-CoV-2/FLU/RSV  testing. Fact Sheet for Patients: https://www.moore.com/https://www.fda.gov/media/142436/download Fact Sheet for Healthcare Providers: https://www.young.biz/https://www.fda.gov/media/142435/download This  test is not yet approved or cleared by the [place] FDA and  has been authorized for detection and/or diagnosis of SARS-CoV-2 by  FDA under an Emergency Use Authorization (EUA). This EUA will remain  in effect (  meaning this test can be used) for the duration of the  Covid-19 declaration under Section 564(b)(1) of the Act, 21  U.S.C. section 360bbb-3(b)(1), unless the authorization is  terminated or revoked. Performed at [facility], [place] [address], [place], [place] [address]    No results found.  Review of Systems  Constitutional: Positive for fatigue.  HENT: Negative.   Eyes: Negative.   Respiratory: Negative.   Cardiovascular: Negative.   Gastrointestinal: Positive for abdominal pain, constipation and nausea.  Endocrine: Negative.   Genitourinary: Negative.   Musculoskeletal: Negative.   Allergic/Immunologic: Negative.   Neurological: Negative.   Hematological: Negative.   Psychiatric/Behavioral: Negative.     Blood pressure 108/84, pulse 70, temperature 98.3 F (36.8 C), temperature source Oral, resp. rate 18, SpO2 100 %, unknown if currently breastfeeding. Physical Exam  Constitutional: She is oriented to person, place, and time. She appears well-developed and well-nourished.  HENT:  Head: Normocephalic.  Right Ear: External ear normal.  Left Ear: External ear normal.  Mouth/Throat: Oropharynx is clear and moist.  Eyes: Pupils are equal, round, and reactive to light. EOM are normal.  Cardiovascular: Normal rate, regular rhythm and normal heart sounds.  Respiratory: Effort normal and breath sounds normal. No respiratory distress. She has no wheezes. She has no rales.  GI: Soft. She exhibits no distension. There is abdominal tenderness. There is no rebound and no guarding.  Tender right lower quadrant without peritonitis  Musculoskeletal:        General: No edema. Normal range of motion.     Cervical back: Neck supple.  Neurological: She is alert and  oriented to person, place, and time. She exhibits normal muscle tone.  Skin: Skin is warm and dry.  Psychiatric: She has a normal mood and affect.     Assessment/Plan Acute appendicitis we will give Rocephin and Flagyl IV.  We will proceed with laparoscopic appendectomy tonight.  I reviewed the procedure, risks, benefits, & expected postoperative course.  She is agreeable.  Her COVID test is negative.  I reviewed her CT scan from the CD.  Zenovia Jarred, MD 11/22/2019, 7:40 PM

## 2019-11-22 NOTE — Anesthesia Procedure Notes (Signed)
Procedure Name: Intubation Date/Time: 11/22/2019 8:15 PM Performed by: Oletta Lamas, CRNA Pre-anesthesia Checklist: Patient identified, Emergency Drugs available, Suction available and Patient being monitored Patient Re-evaluated:Patient Re-evaluated prior to induction Oxygen Delivery Method: Circle System Utilized Preoxygenation: Pre-oxygenation with 100% oxygen Induction Type: IV induction Tube type: Oral Number of attempts: 1 Airway Equipment and Method: Stylet Placement Confirmation: ETT inserted through vocal cords under direct vision,  positive ETCO2 and breath sounds checked- equal and bilateral Secured at: 22 cm Tube secured with: Tape Dental Injury: Teeth and Oropharynx as per pre-operative assessment

## 2019-11-22 NOTE — ED Provider Notes (Signed)
[facility] EMERGENCY DEPARTMENT Provider Note   CSN: [id number] Arrival date & time: 11/22/19  1646     History Chief Complaint  Patient presents with  . appendicitis    Shelly Allen is a 33 y.o. female with no significant past medical history who presents today for evaluation of appendicitis.  She reports that over the past 24 hours she has had right sided abdominal pain.  It initially started as part of a diffuse crampy pain however has localized into her right lower quadrant.  She reports that yesterday she had 1 episode of nausea and vomiting however that has resolved.  She denies any constipation or diarrhea.  She denies any prior abdominal surgeries.  She denies any fevers or known sick contacts.  She denies any dysuria increased frequency or urgency.  No vaginal discharge  HPI     Past Medical History:  Diagnosis Date  . ADHD     Patient Active Problem List   Diagnosis Date Noted  . Supervision of normal pregnancy 10/30/2018    Past Surgical History:  Procedure Laterality Date  . NO PAST SURGERIES       OB History    Gravida  2   Para  1   Term  1   Preterm      AB  1   Living  1     SAB  1   TAB      Ectopic      Multiple  0   Live Births  1           Family History  Problem Relation Age of Onset  . Breast cancer Maternal Grandmother   . Diabetes Maternal Grandmother   . Cancer Maternal Grandmother     Social History   Tobacco Use  . Smoking status: Never Smoker  . Smokeless tobacco: Never Used  Substance Use Topics  . Alcohol use: No  . Drug use: No    Home Medications Prior to Admission medications   Medication Sig Start Date End Date Taking? Authorizing Provider  calcium carbonate (TUMS - DOSED IN MG ELEMENTAL CALCIUM) 500 MG chewable tablet Chew 2 tablets by mouth daily as needed for indigestion or heartburn.    [provider]  ibuprofen (ADVIL,MOTRIN) 600 MG tablet Take 1 tablet (600 mg  total) by mouth every 6 (six) hours. 11/01/18   Thurnell Lose, MD  Prenatal Vit-Fe Fumarate-FA (MULTIVITAMIN-PRENATAL) 27-0.8 MG TABS tablet Take 1 tablet by mouth daily at 12 noon.    [provider]    Allergies    Patient has no known allergies.  Review of Systems   Review of Systems  Constitutional: Negative for chills and fever.  Respiratory: Negative for chest tightness and shortness of breath.   Cardiovascular: Negative for chest pain.  Gastrointestinal: Positive for abdominal pain, nausea and vomiting. Negative for constipation and diarrhea.  Genitourinary: Negative for dysuria and urgency.  Musculoskeletal: Negative for back pain.  Skin: Negative for color change.  Neurological: Negative for dizziness and weakness.  Psychiatric/Behavioral: Negative for confusion.  All other systems reviewed and are negative.   Physical Exam Updated Vital Signs BP 108/84 (BP Location: Right Arm)   Pulse 70   Temp 98.3 F (36.8 C) (Oral)   Resp 18   SpO2 100%   Physical Exam Vitals and nursing note reviewed.  Constitutional:      General: She is not in acute distress.    Appearance: She is well-developed.  HENT:  Head: Normocephalic and atraumatic.  Eyes:     Conjunctiva/sclera: Conjunctivae normal.  Cardiovascular:     Rate and Rhythm: Normal rate and regular rhythm.     Heart sounds: No murmur.  Pulmonary:     Effort: Pulmonary effort is normal. No respiratory distress.     Breath sounds: Normal breath sounds.  Abdominal:     General: Abdomen is flat. Bowel sounds are increased.     Palpations: Abdomen is soft.     Tenderness: There is abdominal tenderness in the right upper quadrant and right lower quadrant. There is no guarding or rebound.  Musculoskeletal:     Cervical back: Normal range of motion and neck supple.  Skin:    General: Skin is warm and dry.  Neurological:     General: No focal deficit present.     Mental Status: She is alert.    Psychiatric:        Mood and Affect: Mood normal.        Behavior: Behavior normal.     ED Results / Procedures / Treatments   Labs (all labs ordered are listed, but only abnormal results are displayed) Labs Reviewed  COMPREHENSIVE METABOLIC PANEL - Abnormal; Notable for the following components:      Result Value   Sodium 133 (*)    CO2 21 (*)    Calcium 8.7 (*)    Total Bilirubin 2.3 (*)    All other components within normal limits  URINALYSIS, ROUTINE W REFLEX MICROSCOPIC - Abnormal; Notable for the following components:   Color, Urine COLORLESS (*)    Specific Gravity, Urine 1.032 (*)    Ketones, ur 5 (*)    All other components within normal limits  RESPIRATORY PANEL BY RT PCR (FLU A&B, COVID)  LIPASE, BLOOD  CBC  I-STAT BETA HCG BLOOD, ED ([facility], [facility], [facility] ONLY)    EKG None  Radiology- obtained earlier today as an outpatient, results copied from [facility] everywhere No results found.  11/22/2019 4:17 PM EST  TECHNIQUE: CT abdomen and pelvis. 80 mL Isovue-370. Radiation dose reduction was utilized, (automated exposure control, mA or kV adjustment based on patient size, or interative image reconstruction).   Comparison: None.  INDICATION:Right lower quadrant pain  FINDINGS:  Chest:   Lung bases are clear. Heart size normal.  Abdomen and Pelvis:  # Liver: Normal.  # Gallbladder:Normal. # Spleen:Normal # Pancreas:Normal. # Adrenals:Normal. # Kidneys: 9 mm circumscribed low-attenuation lesion in the left renal cortex most likely a cyst. Similar but smaller probable cyst in the anterior right kidney. 4 mm nonobstructing stone present in the mid right kidney with a more punctate stone  slightly more medially.. # Ureters: Normal where visible # Urinary Bladder: Nearly empty, grossly normal. # Reproductive organs: No pelvic mass. Small amount of free fluid. 24 mm cyst posterior to the uterus, likely ovarian. IUD present..  # Vessels:Normal aorta, visceral  arteries, visceral veins and IVC.  # Lymph nodes:No retroperitoneal or pelvic adenopathy. # AV:[id number]:Appendix is dilated and surrounded by edematous mesentery. No evidence of perforation or abscess at this time. GI tract is otherwise unremarkable. # MSK: No acute or aggressive bone lesions..   IMPRESSION:  1. Acute appendicitis. 2. Nonobstructing right kidney stones. 3. Bilateral renal cortical cysts. 2.4 cm pelvic cyst, likely ovarian   Electronically Signed by: Bernadene BellGeoffrey Rieser   Procedures Procedures (including critical care time)  Medications Ordered in ED Medications  sodium chloride flush (NS) 0.9 % injection 3 mL (has  no administration in time range)  cefTRIAXone (ROCEPHIN) 2 g in sodium chloride 0.9 % 100 mL IVPB (has no administration in time range)    And  metroNIDAZOLE (FLAGYL) IVPB 500 mg (has no administration in time range)  ondansetron (ZOFRAN) injection 4 mg (has no administration in time range)  morphine 4 MG/ML injection 4 mg (has no administration in time range)  sodium chloride 0.9 % bolus 1,000 mL (1,000 mLs Intravenous New Bag/Given 11/22/19 1825)    ED Course  I have reviewed the triage vital signs and the nursing notes.  Pertinent labs & imaging results that were available during my care of the patient were reviewed by me and considered in my medical decision making (see chart for details).    MDM Rules/Calculators/A&P                     Patient presents today for appendicitis.  She had outpatient imaging obtained earlier today for 24 hours of right lower quadrant abdominal pain which showed acute appendicitis and nonobstructing right kidney stone.  Rapid Covid test was sent, CBC is unremarkable.  CMP with mild Elevated total bili at 2.3.  UA consistent with dehydration with a specific gravity over 1.032.  1 L of IV fluids was ordered.  Rocephin and Flagyl were ordered in addition to morphine and Zofran for pain and nausea.  General surgery was consulted.   Surgery will take patient to OR.    Patient remained hemodynamically stable while in my care.   Final Clinical Impression(s) / ED Diagnoses Final diagnoses:  Acute appendicitis with localized peritonitis, without perforation, abscess, or gangrene    Rx / DC Orders ED Discharge Orders    None       Norman Clay 11/22/19 1955    Sabas Sous, MD 11/23/19 1536

## 2019-11-22 NOTE — ED Triage Notes (Signed)
C/o RLQ pain x 24 hrs.  Pt had CT at [facility] that showed acute appendicitis and non obstructing R kidney stone.  Reports nausea yesterday.  Denies nausea today and denies vomiting.

## 2019-11-22 NOTE — Op Note (Signed)
  11/22/2019  8:58 PM  PATIENT:  Shelly Allen  33 y.o. female  PRE-OPERATIVE DIAGNOSIS:  Acute appendicitis  POST-OPERATIVE DIAGNOSIS:  Suppurative appendicitis  PROCEDURE:  Procedure(s): APPENDECTOMY LAPAROSCOPIC  SURGEON:  Surgeon(s): Georganna Skeans, MD  ASSISTANTS: none   ANESTHESIA:   local and general  EBL:  No intake/output data recorded.  BLOOD ADMINISTERED:none  DRAINS: none   SPECIMEN:  Excision  DISPOSITION OF SPECIMEN:  PATHOLOGY  COUNTS:  YES  DICTATION: .Dragon Dictation Findings: Suppurative appendicitis without perforation  Procedure in detail: The patient was identified in preop holding area.  Informed consent was obtained.  She received intravenous antibiotics.  She was brought to the operating room and general endotracheal anesthesia was administered by the anesthesia staff.  Her abdomen was prepped and draped in sterile fashion.  We did a timeout procedure.The infraumbilical region was infiltrated with local. Infraumbilical incision was made. Subcutaneous tissues were dissected down revealing the anterior fascia. This was divided sharply along the midline. Peritoneal cavity was entered under direct vision without complication. A 0 Vicryl pursestring was placed around the fascial opening. Hassan trocar was inserted into the abdomen. The abdomen was insufflated with carbon dioxide in standard fashion. Under direct vision a 12 mm left lower quadrant and a 5 mm right mid abdomen port were placed.  Local was used at each port site.  Laparoscopic exploration revealed separative appendicitis without perforation.  The appendix was adherent along the underneath portion of the cecum extending laterally.  The base was dissected out first.  The base was then divided with Endo GIA with a vascular load.  There was excellent staple line closure.  The mesoappendix was divided with a harmonic scalpel.  The appendix was gently teased off of the underside of the cecum and  removed.  The appendix was placed in a bag and removed from the abdomen.  It was sent to pathology.  The abdomen was copiously irrigated.  There was excellent hemostasis.  The staple line remained intact.  Irrigation fluid was evacuated.  Ports were removed under direct vision.  Pneumoperitoneum was released.  Infraumbilical fascia was closed by tying the pursestring.  All 3 wounds were irrigated and closed with 4-0 Vicryl followed by Dermabond.  All counts were correct.  She tolerated procedure well without apparent complication and was taken recovery in stable condition.  PATIENT DISPOSITION:  PACU - hemodynamically stable.   Delay start of Pharmacological VTE agent (>24hrs) due to surgical blood loss or risk of bleeding:  no  Georganna Skeans, MD, MPH, FACS Pager: [phone]  12/31/20208:58 PM

## 2019-11-22 NOTE — Transfer of Care (Signed)
Immediate Anesthesia Transfer of Care Note  Patient: Shelly Allen  Procedure(s) Performed: APPENDECTOMY LAPAROSCOPIC (N/A Abdomen)  Patient Location: PACU  Anesthesia Type:General  Level of Consciousness: awake, alert , oriented and patient cooperative  Airway & Oxygen Therapy: Patient Spontanous Breathing and Patient connected to face mask oxygen  Post-op Assessment: Report given to RN and Post -op Vital signs reviewed and stable  Post vital signs: Reviewed and stable  Last Vitals:  Vitals Value Taken Time  BP 110/68 11/22/19 2145  Temp 36.9 C 11/22/19 2119  Pulse 90 11/22/19 2147  Resp 10 11/22/19 2147  SpO2 96 % 11/22/19 2147  Vitals shown include unvalidated device data.  Last Pain:  Vitals:   11/22/19 2135  TempSrc:   PainSc: 0-No pain         Complications: No apparent anesthesia complications   Breath sounds clear and equal bilateral. RR even and unlabored.  Saturation upper 90's on FM 5L.  Non-productive cough.

## 2019-11-23 ENCOUNTER — Encounter (HOSPITAL_COMMUNITY): Payer: Self-pay | Admitting: General Surgery

## 2019-11-23 DIAGNOSIS — K353 Acute appendicitis with localized peritonitis, without perforation or gangrene: Secondary | ICD-10-CM | POA: Diagnosis not present

## 2019-11-23 MED ORDER — OXYCODONE HCL 5 MG PO TABS: 5.0000 mg | ORAL_TABLET | ORAL | 0 refills | Status: AC | PRN

## 2019-11-23 NOTE — Discharge Instructions (Signed)
Laparoscopic Appendectomy, Adult, Care After This sheet gives you information about how to care for yourself after your procedure. Your health care provider may also give you more specific instructions. If you have problems or questions, contact your health care provider. What can I expect after the procedure? After the procedure, it is common to have:  Little energy for normal activities.  Mild pain in the area where the incisions were made.  Difficulty passing stool (constipation). This can be caused by: ? Pain medicine. ? A decrease in your activity. Follow these instructions at home: Medicines  Take over-the-counter and prescription medicines only as told by your health care provider.  If you were prescribed an antibiotic medicine, take it as told by your health care provider. Do not stop taking the antibiotic even if you start to feel better.  Do not drive or use heavy machinery while taking prescription pain medicine.  Ask your health care provider if the medicine prescribed to you can cause constipation. You may need to take steps to prevent or treat constipation, such as: ? Drink enough fluid to keep your urine pale yellow. ? Take over-the-counter or prescription medicines. ? Eat foods that are high in fiber, such as beans, whole grains, and fresh fruits and vegetables. ? Limit foods that are high in fat and processed sugars, such as fried or sweet foods. Incision care   Follow instructions from your health care provider about how to take care of your incisions. Make sure you: ? Wash your hands with soap and water before and after you change your bandage (dressing). If soap and water are not available, use hand sanitizer. ? Change your dressing as told by your health care provider. ? Leave stitches (sutures), skin glue, or adhesive strips in place. These skin closures may need to stay in place for 2 weeks or longer. If adhesive strip edges start to loosen and curl up, you may  trim the loose edges. Do not remove adhesive strips completely unless your health care provider tells you to do that.  Check your incision areas every day for signs of infection. Check for: ? Redness, swelling, or pain. ? Fluid or blood. ? Warmth. ? Pus or a bad smell. Bathing  Keep your incisions clean and dry. Clean them as often as told by your health care provider. To do this: 1. Gently wash the incisions with soap and water. 2. Rinse the incisions with water to remove all soap. 3. Pat the incisions dry with a clean towel. Do not rub the incisions.  Do not take baths, swim, or use a hot tub for 2 weeks, or until your health care provider approves. You may take showers after 48 hours. Activity   Do not drive for 24 hours if you were given a sedative during your procedure.  Rest after the procedure. Return to your normal activities as told by your health care provider. Ask your health care provider what activities are safe for you.  For 3 weeks, or for as long as told by your health care provider: ? Do not lift anything that is heavier than 10 lb (4.5 kg), or the limit that you are told. ? Do not play contact sports. General instructions  If you were sent home with a drain, follow instructions from your health care provider about how to care for it.  Take deep breaths. This helps to prevent your lungs from developing an infection (pneumonia).  Keep all follow-up visits as told by your health   care provider. This is important. Contact a health care provider if:  You have redness, swelling, or pain around an incision.  You have fluid or blood coming from an incision.  Your incision feels warm to the touch.  You have pus or a bad smell coming from an incision or dressing.  Your incision edges break open after your sutures have been removed.  You have increasing pain in your shoulders.  You feel dizzy or you faint.  You develop shortness of breath.  You keep feeling  nauseous or you are vomiting.  You have diarrhea or you cannot control your bowel functions.  You lose your appetite.  You develop swelling or pain in your legs.  You develop a rash. Get help right away if you have:  A fever.  Difficulty breathing.  Sharp pains in your chest. Summary  After a laparoscopic appendectomy, it is common to have little energy for normal activities, mild pain in the area of the incisions, and constipation.  Infection is the most common complication after this procedure. Follow your health care provider's instructions about caring for yourself after the procedure.  Rest after the procedure. Return to your normal activities as told by your health care provider.  Contact your health care provider if you notice signs of infection around your incisions or you develop shortness of breath. Get help right away if you have a fever, chest pain, or difficulty breathing. This information is not intended to replace advice given to you by your health care provider. Make sure you discuss any questions you have with your health care provider. Document Revised: 05/11/2018 Document Reviewed: 05/11/2018 Elsevier Patient Education  2020 [employer] LAPAROSCOPIC SURGERY: POST OP INSTRUCTIONS  1. DIET: Follow a light bland diet the first 24 hours after arrival home, such as soup, liquids, crackers, etc.  Be sure to include lots of fluids daily.  Avoid fast food or heavy meals as your are more likely to get nauseated.  Eat a low fat the next few days after surgery.   2. Take your usually prescribed home medications unless otherwise directed. 3. PAIN CONTROL: a. Pain is best controlled by a usual combination of three different methods TOGETHER: i. Ice/Heat ii. Over the counter pain medication iii. Prescription pain medication b. Most patients will experience some swelling and bruising around the incisions.  Ice packs or heating pads (30-60 minutes up to 6 times a day) will  help. Use ice for the first few days to help decrease swelling and bruising, then switch to heat to help relax tight/sore spots and speed recovery.  Some people prefer to use ice alone, heat alone, alternating between ice & heat.  Experiment to what works for you.  Swelling and bruising can take several weeks to resolve.   c. It is helpful to take an over-the-counter pain medication regularly for the first few weeks.  Choose one of the following that works best for you: i. Naproxen (Aleve, etc)  Two 220mg  tabs twice a day ii. Ibuprofen (Advil, etc) Three 200mg  tabs four times a day (every meal & bedtime) d. A  prescription for pain medication (such as percocet, vicodin, oxycodone, hydrocodone, etc) should be given to you upon discharge.  Take your pain medication as prescribed.  i. If you are having problems/concerns with the prescription medicine (does not control pain, nausea, vomiting, rash, itching, etc), please call [phone] to see if we need to switch you to a different pain medicine that will work  better for you and/or control your side effect better. ii. If you need a refill on your pain medication, please contact your pharmacy.  They will contact our office to request authorization. Prescriptions will not be filled after 5 pm or on week-ends.   4. Avoid getting constipated.  Between the surgery and the pain medications, it is common to experience some constipation.  Increasing fluid intake and taking a fiber supplement (such as Metamucil, Citrucel, FiberCon, MiraLax, etc) 1-2 times a day regularly will usually help prevent this problem from occurring.  A mild laxative (prune juice, Milk of Magnesia, MiraLax, etc) should be taken according to package directions if there are no bowel movements after 48 hours.   5. Watch out for diarrhea.  If you have many loose bowel movements, simplify your diet to bland foods & liquids for a few days.  Stop any stool softeners and decrease your fiber  supplement.  Switching to mild anti-diarrheal medications (Kayopectate, Pepto Bismol) can help.  If this worsens or does not improve, please call us. 6. Wash / shower every day.  You may shower over the dressings as they are waterproof.  Continue to shower over incision(s) after the dressing is off. 7. Remove your waterproof bandages 5 days after surgery.  You may leave the incision open to air.  You may replace a dressing/Band-Aid to cover the incision for comfort if you wish.  8. ACTIVITIES as tolerated:   a. You may resume regular (light) daily activities beginning the next day--such as daily self-care, walking, climbing stairs--gradually increasing activities as tolerated.  If you can walk 30 minutes without difficulty, it is safe to try more intense activity such as jogging, treadmill, bicycling, low-impact aerobics, swimming, etc. b. Save the most intensive and strenuous activity for last such as sit-ups, heavy lifting, contact sports, etc  Refrain from any heavy lifting or straining until you are off narcotics for pain control.   c. DO NOT PUSH THROUGH PAIN.  Let pain be your guide: If it hurts to do something, don't do it.  Pain is your body warning you to avoid that activity for another week until the pain goes down. d. You may drive when you are no longer taking prescription pain medication, you can comfortably wear a seatbelt, and you can safely maneuver your car and apply brakes. e. Dennis Bast may have sexual intercourse when it is comfortable.  9. FOLLOW UP in our office a. Please call [facility] at [phone] to set up an appointment to see your surgeon in the office for a follow-up appointment approximately 2-3 weeks after your surgery. b. Make sure that you call for this appointment the day you arrive home to insure a convenient appointment time. 10. IF YOU HAVE DISABILITY OR FAMILY LEAVE FORMS, BRING THEM TO THE OFFICE FOR PROCESSING.  DO NOT GIVE THEM TO YOUR DOCTOR.   WHEN TO CALL us [phone]  [phone]: 1. Poor pain control 2. Reactions / problems with new medications (rash/itching, nausea, etc)  3. Fever over 101.5 F (38.5 C) 4. Inability to urinate 5. Nausea and/or vomiting 6. Worsening swelling or bruising 7. Continued bleeding from incision. 8. Increased pain, redness, or drainage from the incision   The clinic staff is available to answer your questions during regular business hours (8:30am-5pm).  Please don't hesitate to call and ask to speak to one of our nurses for clinical concerns.   If you have a medical emergency, go to the nearest emergency room or call 911.  A surgeon from [facility] is always on call at the [facility], [place] [address], [place], [place]  [address] ? MAIN: [phone] ? TOLL FREE: [phone] ?  FAX [phone] [email or web address]

## 2019-11-23 NOTE — Progress Notes (Signed)
1 Day Post-Op lap appy Subjective: Pain controlled, feels SOB but per pt O2 sats fine on RA, wearing O2 for comfort.  Tolerated a diet.  Objective: Vital signs in last 24 hours: Temp:  [98 F (36.7 C)-98.4 F (36.9 C)] 98.3 F (36.8 C) (01/01 0536) Pulse Rate:  [61-98] 61 (01/01 0536) Resp:  [14-18] 18 (01/01 0536) BP: (99-122)/(61-84) 108/76 (01/01 0536) SpO2:  [94 %-100 %] 98 % (01/01 0815)   Intake/Output from previous day: 12/31 0701 - 01/01 0700 In: 800 [P.O.:300; I.V.:500] Out: -  Intake/Output this shift: No intake/output data recorded.   General appearance: alert and cooperative GI: normal findings: soft, mildly distended  Incision: no significant drainage  Lab Results:  Recent Labs    11/22/19 1727  WBC 8.7  HGB 13.3  HCT 40.0  PLT 213   BMET Recent Labs    11/22/19 1727  NA 133*  K 3.8  CL 99  CO2 21*  GLUCOSE 85  BUN 7  CREATININE 0.72  CALCIUM 8.7*   PT/INR No results for input(s): LABPROT, INR in the last 72 hours. ABG No results for input(s): PHART, HCO3 in the last 72 hours.  Invalid input(s): PCO2, PO2  MEDS, Scheduled . celecoxib  200 mg Oral BID  . enoxaparin (LOVENOX) injection  40 mg Subcutaneous Q24H  . gabapentin  300 mg Oral BID  . menthol-cetylpyridinium      . midazolam      .  morphine injection  4 mg Intravenous Once  . ondansetron (ZOFRAN) IV  4 mg Intravenous Once  . sodium chloride flush  3 mL Intravenous Once    Studies/Results: No results found.  Assessment: s/p Procedure(s): APPENDECTOMY LAPAROSCOPIC Patient Active Problem List   Diagnosis Date Noted  . Appendicitis 11/22/2019  . Supervision of normal pregnancy 10/30/2018    Expected post op course  Plan: Discharge today    LOS: 1 day     .Vanita Panda, MD [facility], [place]    11/23/2019 9:30 AM

## 2019-11-23 NOTE — Progress Notes (Signed)
Pt discharged to home with husband, AVS reviewed and copy given.  Rx for oxycodone is ready at [facility] for pt pickup.  Instructed pt to call [facility] on Monday to make follow up appointment.  No questions about home self care.  Instructed pt to avoid heavy lifting at home (has 33 year old) and informed on other s/s of infection.  Sent IS home with pt to do breathing exercises.

## 2019-11-23 NOTE — Progress Notes (Signed)
Pt has voided, advanced to regular diet.  C/o some right shoulder pain, given PO pain med and zofran.  Reinforced IS use, pt demonstrated great technique.

## 2019-11-23 NOTE — Discharge Summary (Signed)
Patient ID: Shelly Allen [id number] 33 y.o. [birth year]  11/22/2019  Discharge date and time: No discharge date for patient encounter.  Admitting Physician: Violeta Gelinas  Discharge Physician: Vanita Panda  Admission Diagnoses: Acute appendicitis with localized peritonitis, without perforation, abscess, or gangrene [K35.30] Appendicitis [K37]  Discharge Diagnoses: same  Operations: Procedure(s): APPENDECTOMY LAPAROSCOPIC    Discharged Condition: good    Hospital Course: Pt admitted after surgery.  Diet was advanced.  Once tolerating a diet and PO narcotics, she was felt to be in stable condition for discharge to home.    Consults: None  Significant Diagnostic Studies: labs: cbc, chemistry  Treatments: antibiotics: ceftriaxone, analgesia: oxycodone and surgery: lap appy  Disposition: Home

## 2019-11-24 NOTE — Anesthesia Postprocedure Evaluation (Signed)
Anesthesia Post Note  Patient: Shelly Allen  Procedure(s) Performed: APPENDECTOMY LAPAROSCOPIC (N/A Abdomen)     Patient location during evaluation: PACU Anesthesia Type: General Level of consciousness: awake and alert Pain management: pain level controlled Vital Signs Assessment: post-procedure vital signs reviewed and stable Respiratory status: spontaneous breathing, nonlabored ventilation, respiratory function stable and patient connected to nasal cannula oxygen Cardiovascular status: blood pressure returned to baseline and stable Postop Assessment: no apparent nausea or vomiting Anesthetic complications: no    Last Vitals:  Vitals:   11/23/19 0815 11/23/19 1101  BP:  117/75  Pulse:  70  Resp:  18  Temp:  36.8 C  SpO2: 98% 96%    Last Pain:  Vitals:   11/23/19 1140  TempSrc:   PainSc: 4                  Macintyre Alexa S

## 2019-11-24 NOTE — Anesthesia Preprocedure Evaluation (Addendum)
Anesthesia Evaluation  Patient identified by MRN, date of birth, ID band Patient awake    Reviewed: Allergy & Precautions, H&P , NPO status , Patient's Chart, lab work & pertinent test results  Airway Mallampati: II   Neck ROM: full    Dental   Pulmonary asthma ,    breath sounds clear to auscultation       Cardiovascular negative cardio ROS   Rhythm:regular Rate:Normal     Neuro/Psych    GI/Hepatic appendicitis   Endo/Other    Renal/GU      Musculoskeletal   Abdominal   Peds  Hematology   Anesthesia Other Findings   Reproductive/Obstetrics                             Anesthesia Physical Anesthesia Plan  ASA: II and emergent  Anesthesia Plan: General   Post-op Pain Management:    Induction: Intravenous  PONV Risk Score and Plan: 3 and Ondansetron, Dexamethasone, Midazolam and Treatment may vary due to age or medical condition  Airway Management Planned: Oral ETT  Additional Equipment:   Intra-op Plan:   Post-operative Plan: Extubation in OR  Informed Consent: I have reviewed the patients History and Physical, chart, labs and discussed the procedure including the risks, benefits and alternatives for the proposed anesthesia with the patient or authorized representative who has indicated his/her understanding and acceptance.       Plan Discussed with: CRNA, Anesthesiologist and Surgeon  Anesthesia Plan Comments:         Anesthesia Quick Evaluation

## 2019-11-26 ENCOUNTER — Other Ambulatory Visit: Payer: Self-pay | Admitting: Family Medicine

## 2019-11-26 DIAGNOSIS — R1031 Right lower quadrant pain: Secondary | ICD-10-CM

## 2019-11-26 LAB — SURGICAL PATHOLOGY

## 2020-09-05 DIAGNOSIS — F902 Attention-deficit hyperactivity disorder, combined type: Secondary | ICD-10-CM | POA: Diagnosis not present

## 2020-12-01 DIAGNOSIS — F902 Attention-deficit hyperactivity disorder, combined type: Secondary | ICD-10-CM | POA: Diagnosis not present

## 2021-03-02 DIAGNOSIS — F902 Attention-deficit hyperactivity disorder, combined type: Secondary | ICD-10-CM | POA: Diagnosis not present

## 2021-05-18 DIAGNOSIS — F902 Attention-deficit hyperactivity disorder, combined type: Secondary | ICD-10-CM | POA: Diagnosis not present

## 2021-08-13 DIAGNOSIS — F902 Attention-deficit hyperactivity disorder, combined type: Secondary | ICD-10-CM | POA: Diagnosis not present

## 2021-08-18 DIAGNOSIS — Z3202 Encounter for pregnancy test, result negative: Secondary | ICD-10-CM | POA: Diagnosis not present

## 2021-08-18 DIAGNOSIS — Z01419 Encounter for gynecological examination (general) (routine) without abnormal findings: Secondary | ICD-10-CM | POA: Diagnosis not present

## 2021-11-10 DIAGNOSIS — F902 Attention-deficit hyperactivity disorder, combined type: Secondary | ICD-10-CM | POA: Diagnosis not present

## 2022-02-01 DIAGNOSIS — Z13220 Encounter for screening for lipoid disorders: Secondary | ICD-10-CM | POA: Diagnosis not present

## 2022-02-01 DIAGNOSIS — Z0000 Encounter for general adult medical examination without abnormal findings: Secondary | ICD-10-CM | POA: Diagnosis not present

## 2022-02-02 DIAGNOSIS — F902 Attention-deficit hyperactivity disorder, combined type: Secondary | ICD-10-CM | POA: Diagnosis not present

## 2022-04-27 DIAGNOSIS — F902 Attention-deficit hyperactivity disorder, combined type: Secondary | ICD-10-CM | POA: Diagnosis not present

## 2022-06-21 DIAGNOSIS — J020 Streptococcal pharyngitis: Secondary | ICD-10-CM | POA: Diagnosis not present

## 2022-12-10 DIAGNOSIS — F902 Attention-deficit hyperactivity disorder, combined type: Secondary | ICD-10-CM | POA: Diagnosis not present

## 2023-03-11 DIAGNOSIS — F902 Attention-deficit hyperactivity disorder, combined type: Secondary | ICD-10-CM | POA: Diagnosis not present

## 2023-06-07 DIAGNOSIS — F902 Attention-deficit hyperactivity disorder, combined type: Secondary | ICD-10-CM | POA: Diagnosis not present
